# Patient Record
Sex: Female | Born: 1960 | Race: Black or African American | Hispanic: No | Marital: Single | Smoking: Current every day smoker
Health system: Southern US, Community
[De-identification: ages and names within clinical notes are randomized; demographics above are authoritative.]

## PROBLEM LIST (undated history)

## (undated) DIAGNOSIS — J45909 Unspecified asthma, uncomplicated: Secondary | ICD-10-CM

## (undated) HISTORY — PX: ABDOMINAL HYSTERECTOMY: SHX81

## (undated) HISTORY — PX: CATARACT EXTRACTION, BILATERAL: SHX1313

## (undated) HISTORY — PX: FRACTURE SURGERY: SHX138

## (undated) HISTORY — PX: HERNIA REPAIR: SHX51

## (undated) HISTORY — PX: CARPAL TUNNEL RELEASE: SHX101

---

## 2007-09-03 HISTORY — PX: BREAST BIOPSY: SHX20

## 2011-09-03 HISTORY — PX: TOTAL KNEE ARTHROPLASTY: SHX125

## 2016-02-15 LAB — HEMOGLOBIN A1C: Hemoglobin A1C: 5.9

## 2016-02-15 LAB — VITAMIN D 25 HYDROXY (VIT D DEFICIENCY, FRACTURES): Vit D, 25-Hydroxy: 20.3

## 2016-02-15 LAB — VITAMIN B12: Vitamin B-12: 517

## 2017-10-03 LAB — HM PAP SMEAR: HM Pap smear: NEGATIVE

## 2017-10-31 LAB — RESULTS CONSOLE HPV: CHL HPV: NEGATIVE

## 2018-05-22 LAB — TSH: TSH: 1.21 (ref 0.41–5.90)

## 2018-05-22 LAB — HEPATIC FUNCTION PANEL
Alkaline Phosphatase: 107 (ref 25–125)
Bilirubin, Total: 0.3

## 2018-05-22 LAB — CBC AND DIFFERENTIAL
HCT: 43 (ref 36–46)
Hemoglobin: 14.2 (ref 12.0–16.0)
Neutrophils Absolute: 3
Platelets: 8 — AB (ref 150–399)
WBC: 6.8

## 2018-05-22 LAB — BASIC METABOLIC PANEL
Creatinine: 0.6 (ref 0.5–1.1)
Glucose: 105
Potassium: 4.1 (ref 3.4–5.3)
Sodium: 140 (ref 137–147)

## 2018-05-22 LAB — CBC: RBC: 4.65 (ref 3.87–5.11)

## 2018-05-22 LAB — COMPREHENSIVE METABOLIC PANEL: GFR calc Af Amer: 89

## 2019-03-04 ENCOUNTER — Ambulatory Visit: Admission: EM | Admit: 2019-03-04 | Discharge: 2019-03-04 | Disposition: A | Discharge status: Home / Self Care

## 2019-03-04 ENCOUNTER — Encounter: Payer: Self-pay | Admitting: Emergency Medicine

## 2019-03-04 ENCOUNTER — Ambulatory Visit (INDEPENDENT_AMBULATORY_CARE_PROVIDER_SITE_OTHER)

## 2019-03-04 ENCOUNTER — Other Ambulatory Visit: Payer: Self-pay

## 2019-03-04 DIAGNOSIS — M25512 Pain in left shoulder: Secondary | ICD-10-CM

## 2019-03-04 DIAGNOSIS — X500XXA Overexertion from strenuous movement or load, initial encounter: Secondary | ICD-10-CM | POA: Diagnosis not present

## 2019-03-04 DIAGNOSIS — M7542 Impingement syndrome of left shoulder: Secondary | ICD-10-CM | POA: Diagnosis not present

## 2019-03-04 HISTORY — DX: Unspecified asthma, uncomplicated: J45.909

## 2019-03-04 MED ORDER — MELOXICAM 15 MG PO TABS: 15.0000 mg | ORAL_TABLET | Freq: Every day | ORAL | 0 refills | Status: DC

## 2019-03-04 MED ORDER — METHYLPREDNISOLONE 4 MG PO TBPK: ORAL_TABLET | ORAL | 0 refills | Status: DC

## 2019-03-04 NOTE — Discharge Instructions (Addendum)
It was very nice seeing you today in clinic. Thank you for entrusting me with your care.   Please utilize the medications that we discussed. Your prescriptions have been called in to your Pharmacy. Encouraged to apply heat three times a day for at least 10-15 minutes at a time.   Make arrangements to follow up with orthopedic doctor in 1 week for re-evaluation if not improving. I have provided you the name and office contact information for an excellent local provider. If your symptoms/condition worsens, please seek follow up care either here or in the ER. Please remember, our [place] providers are "right here with you" when you need [place].   Again, it was my pleasure to take care of you today. Thank you for choosing our clinic. I hope that you start to feel better quickly.   Honor Loh, MSN, APRN, FNP-C, CEN Advanced Practice Provider [facility]

## 2019-03-04 NOTE — ED Provider Notes (Signed)
[place], [place]   Name: Tamara ParkinsCarenda Neff DOB: 02/23/1961 MRN: [id number] CSN: [id number] PCP: Patient, No Pcp Per  Arrival date and time:  03/04/19 1005  Chief Complaint:  Shoulder Pain   NOTE: Prior to seeing the patient today, I have reviewed the triage nursing documentation and vital signs. Clinical staff has updated patient's PMH/PSHx, current medication list, and drug allergies/intolerances to ensure comprehensive history available to assist in medical decision making.   History:   HPI: Tamara George is a 58 y.o. female who presents today with complaints of progressively worsening pain in her LEFT shoulder than began approximately 1 month ago. Patient denies acute or past history of injury to her LEFT shoulder/upper extremity. She notes that she does a lot of heavy lifting and reaching at work, which is what she feels is contributing to the pain in her shoulder. AROM is painful. She states, "it feels like my shoulder is swelling inside". She has generalized weakness with some intermittent paraesthesias distally. Patient advises that pain is exacerbated by lifting and certain movements. Pain tends to be worse at night as patient is a side sleeper. She notes that her sleep quality has been adversely affected by her pain. Despite her symptoms, patient has not taken any over the counter interventions to help improve/relieve her reported symptoms.    Past Medical History:  Diagnosis Date   Asthma     Past Surgical History:  Procedure Laterality Date   FRACTURE SURGERY     HERNIA REPAIR     REPEAT CESAREAN SECTION     TOTAL KNEE ARTHROPLASTY      Family History  Problem Relation Age of Onset   Hypertension Other    Hypertension Maternal Aunt    Diabetes Maternal Uncle     Social History   Tobacco Use   Smoking status: Current Some Day Smoker   Smokeless tobacco: Never Used  Substance Use Topics   Alcohol use: Never    Frequency: Never   Drug use: Not on file     There are no active problems to display for this patient.   Home Medications:    Current Meds  Medication Sig   albuterol (ACCUNEB) 0.63 MG/3ML nebulizer solution Take 1 ampule by nebulization every 6 (six) hours as needed for wheezing.   Fluticasone-Salmeterol (ADVAIR) 100-50 MCG/DOSE AEPB Inhale 1 puff into the lungs 2 (two) times daily.    Allergies:   Aspirin and Tramadol  Review of Systems (ROS): Review of Systems  Constitutional: Negative for chills and fever.  Respiratory: Negative for cough and shortness of breath.   Cardiovascular: Negative for chest pain and palpitations.  Musculoskeletal: Positive for arthralgias (LEFT shoulder). Negative for joint swelling, myalgias, neck pain and neck stiffness.  Neurological: Positive for weakness (LUE) and numbness (LUE).     Vital Signs: Today's Vitals   03/04/19 1055 03/04/19 1102 03/04/19 1207  BP:  132/83   Pulse:  64   Resp:  18   Temp:  98.2 F (36.8 C)   TempSrc:  Oral   SpO2:  97%   Weight: 165 lb (74.8 kg)    Height: 5\' 2"  (1.575 m)    PainSc: 7   0-No pain    Physical Exam: Physical Exam  Constitutional: She is oriented to person, place, and time and well-developed, well-nourished, and in no distress.  HENT:  Head: Normocephalic and atraumatic.  Mouth/Throat: Mucous membranes are normal.  Eyes: Pupils are equal, round, and reactive to light. EOM are normal.  Neck: Normal range of motion and full passive range of motion without pain. Neck supple. No spinous process tenderness and no muscular tenderness present. No tracheal deviation present.  Cardiovascular: Normal rate, regular rhythm, normal heart sounds and intact distal pulses. Exam reveals no gallop and no friction rub.  No murmur heard. Pulmonary/Chest: Effort normal and breath sounds normal. No respiratory distress. She has no wheezes. She has no rales.  Musculoskeletal:     Left shoulder: She exhibits decreased range of motion (2/2 pain),  tenderness, pain and decreased strength (opposed muscle strength 4/5 as compared contralaterally.). She exhibits no swelling, no crepitus, no deformity, no spasm and normal pulse.     Left upper arm: She exhibits tenderness. She exhibits no swelling and no deformity.     Comments: Normal sensation, temperature, and color. Capillary refill normal.  Neurological: She is alert and oriented to person, place, and time. Gait normal. GCS score is 15.  Reflex Scores:      Tricep reflexes are 3+ on the right side and 4+ on the left side.      Bicep reflexes are 3+ on the right side and 4+ on the left side.      Brachioradialis reflexes are 4+ on the right side and 4+ on the left side. Skin: Skin is warm and dry. No rash noted. No erythema.  Psychiatric: Mood, memory, affect and judgment normal.  Nursing note and vitals reviewed.   Urgent Care Treatments / Results:   LABS: PLEASE NOTE: all labs that were ordered this encounter are listed, however only abnormal results are displayed. Labs Reviewed - No data to display  EKG: -None  RADIOLOGY: Dg Shoulder Left  Result Date: 03/04/2019 CLINICAL DATA:  One month history of left shoulder pain. EXAM: LEFT SHOULDER - 2+ VIEW COMPARISON:  None. FINDINGS: Moderate AC joint and mild glenohumeral joint degenerative changes. Os acromial noted. The visualized left ribs are intact and the visualized left lung is clear. IMPRESSION: AC joint degenerative changes and os acromial could contribute to impingement. Mild glenohumeral joint degenerative changes. No acute bony findings. Electronically Signed   By: Marijo Sanes M.D.   On: 03/04/2019 11:41    PROCEDURES: Procedures  MEDICATIONS RECEIVED THIS VISIT: Medications - No data to display  PERTINENT CLINICAL COURSE NOTES/UPDATES:   Initial Impression / Assessment and Plan / Urgent Care Course:  Pertinent labs & imaging results that were available during my care of the patient were personally reviewed by me  and considered in my medical decision making (see lab/imaging section of note for values and interpretations).  Tamara George is a 58 y.o. female who presents to [facility] today with complaints of Shoulder Pain   Patient overall well appearing and in no acute distress today in clinic. Exam reveals slight muscle weakness and decreased reflexes in patient's LEFT upper extremity. She is able to go throough normal AROM motions, however abduction causes severe pain. (+) intermittent distal paraesthesias. Symptoms have been worsening x 1 month. Diagnostic radiographs of the LEFT shoulder reveal degenerative changes of the [facility] joint that could be contributing to patient's pain. Suspect impingement syndrome of the LEFT shoulder. Given her pain and paraesthesias, will treat with antiinflammatory (meloxicam) and systemic steroid (Medrol) course. She was encouraged to apply heat/ice TID for at least 10-15 minutes at a time. She was encouraged to rest her arm/shoulder as much as possible over the next few days. Specifically, she was advised to avoid heavy lifting and reaching. Patient may benefit  from physical therapy to help with her pain, however I will defer to orthopedics for this recommendation.   Patient needs to be seen for further evaluation by orthopedics if not improving with the prescribed interventions. Name and office contact information provided on today's AVS for Dr. Juanell FairlyKevin Krasinski. Patient advised the she will need to contact the office to schedule an appointment to be seen.   Current clinical condition warrants patient being out of work in order to recover from her current injury/illness. She was provided with the appropriate documentation to provide to her place of employment that will allow for her to RTW on 03/07/2019 with no imposed restrictions.   I have reviewed the follow up and strict return precautions for any new or worsening symptoms. Patient is aware of symptoms that would be  deemed urgent/emergent, and would thus require further evaluation either here or in the emergency department. At the time of discharge, she verbalized understanding and consent with the discharge plan as it was reviewed with her. All questions were fielded by provider and/or clinic staff prior to patient discharge.    Final Clinical Impressions(s) / Urgent Care Diagnoses:   Final diagnoses:  Nontraumatic shoulder pain, left  Impingement syndrome, shoulder, left    New Prescriptions:   Meds ordered this encounter  Medications   methylPREDNISolone (MEDROL DOSEPAK) 4 MG TBPK tablet    Sig: Taper per package instructions.    Dispense:  21 tablet    Refill:  0   meloxicam (MOBIC) 15 MG tablet    Sig: Take 1 tablet (15 mg total) by mouth daily.    Dispense:  30 tablet    Refill:  0    Controlled Substance Prescriptions:  [place] Controlled Substance Registry consulted? Not Applicable  Recommended Follow up Care:  Patient encouraged to follow up with the following provider within the specified time frame, or sooner as dictated by the severity of her symptoms. If she improves, the follow up recommendations are on an as needed basis. As always, she was instructed that for any urgent/emergent care needs, she should seek care either here or in the emergency department for more immediate evaluation.  Follow-up Information    Juanell FairlyKrasinski, Kevin, MD In 1 week.   Specialty: Orthopedic Surgery Contact information: [address] [place] [address] [phone]          NOTE: This note was prepared using Dragon dictation software along with smaller phrase technology. Despite my best ability to proofread, there is the potential that transcriptional errors may still occur from this process, and are completely unintentional.     Verlee MonteGray, Cierra Rothgeb E, NP 03/05/19 2038

## 2019-03-04 NOTE — ED Triage Notes (Signed)
Patient states she has been having left shoulder pain for the past month.  Patient states the pain has gotten increasingly worse

## 2019-04-21 ENCOUNTER — Encounter: Payer: Self-pay | Admitting: Internal Medicine

## 2019-04-21 ENCOUNTER — Ambulatory Visit (INDEPENDENT_AMBULATORY_CARE_PROVIDER_SITE_OTHER): Admitting: Internal Medicine

## 2019-04-21 ENCOUNTER — Other Ambulatory Visit: Payer: Self-pay

## 2019-04-21 VITALS — BP 118/78 | HR 96 | Ht 62.0 in | Wt 144.0 lb

## 2019-04-21 DIAGNOSIS — M1812 Unilateral primary osteoarthritis of first carpometacarpal joint, left hand: Secondary | ICD-10-CM | POA: Diagnosis not present

## 2019-04-21 DIAGNOSIS — J4530 Mild persistent asthma, uncomplicated: Secondary | ICD-10-CM

## 2019-04-21 DIAGNOSIS — G8929 Other chronic pain: Secondary | ICD-10-CM | POA: Diagnosis not present

## 2019-04-21 DIAGNOSIS — M25512 Pain in left shoulder: Secondary | ICD-10-CM | POA: Diagnosis not present

## 2019-04-21 MED ORDER — METHOCARBAMOL 500 MG PO TABS: 500.0000 mg | ORAL_TABLET | Freq: Every day | ORAL | 1 refills | Status: DC

## 2019-04-21 MED ORDER — FLUTICASONE-SALMETEROL 250-50 MCG/DOSE IN AEPB: 1.0000 | INHALATION_SPRAY | Freq: Two times a day (BID) | RESPIRATORY_TRACT | 5 refills | Status: DC

## 2019-04-21 MED ORDER — ALBUTEROL SULFATE HFA 108 (90 BASE) MCG/ACT IN AERS: 2.0000 | INHALATION_SPRAY | Freq: Four times a day (QID) | RESPIRATORY_TRACT | 5 refills | Status: DC | PRN

## 2019-04-21 NOTE — Progress Notes (Signed)
Date:  04/21/2019   Name:  Tamara ParkinsCarenda George   DOB:  05/14/1961   MRN:  [id number]   Chief Complaint: Establish Care, Shoulder Pain (Started in May of this year. Seen UC here in [place] and had a XRAY showing a pinched nerve and cervical radiculopathy. Cannot take NSAIDs because they upset her stomach. ), and Asthma (Need RF on Advair and Proair inhalers.)  Shoulder Pain  The pain is present in the left shoulder and left arm. This is a chronic (She notes that she does a lot of heavy lifting and reaching at work, which is what she feels is contributing to the pain in her shoulder. AROM is painful. She states, "it feels like my shoulder is swelling inside". She has generalized weakness with some in) problem. The current episode started more than 1 month ago. The problem occurs constantly. The problem has been gradually worsening. The quality of the pain is described as aching and pounding. The pain is moderate. Associated symptoms include an inability to bear weight, joint swelling and a limited range of motion. Pertinent negatives include no fever. The symptoms are aggravated by activity and lying down. She has tried NSAIDS, cold, heat and acetaminophen (and prednisone taper) for the symptoms. The treatment provided no relief. Her past medical history is significant for osteoarthritis.  Asthma She complains of chest tightness, cough and wheezing. This is a chronic (since childhood) problem. The problem occurs intermittently. The cough is non-productive. Associated symptoms include myalgias. Pertinent negatives include no chest pain, fever or headaches. Her symptoms are alleviated by beta-agonist and steroid inhaler. She reports significant improvement on treatment. Her past medical history is significant for asthma.    Review of Systems  Constitutional: Negative for chills, fatigue and fever.  Respiratory: Positive for cough and wheezing.   Cardiovascular: Negative for chest pain, palpitations and leg  swelling.  Gastrointestinal: Negative for abdominal pain, constipation and diarrhea.  Musculoskeletal: Positive for arthralgias, joint swelling, myalgias and neck stiffness.  Allergic/Immunologic: Negative for environmental allergies.  Neurological: Negative for dizziness, light-headedness and headaches.  Psychiatric/Behavioral: Positive for sleep disturbance (due to pain).    There are no active problems to display for this patient.   Allergies  Allergen Reactions  . Aspirin Diarrhea  . Naproxen Diarrhea  . Tramadol Other (See Comments)    MIGRAINES    Past Surgical History:  Procedure Laterality Date  . ABDOMINAL HYSTERECTOMY     partial- still have cervix and need paps  . carpel tunnel repair    . CATARACT EXTRACTION, BILATERAL    . FRACTURE SURGERY    . HERNIA REPAIR    . REPEAT CESAREAN SECTION    . TOTAL KNEE ARTHROPLASTY      Social History   Tobacco Use  . Smoking status: Current Some Day Smoker    Packs/day: 0.15    Years: 10.00    Pack years: 1.50    Types: Cigarettes  . Smokeless tobacco: Never Used  . Tobacco comment: 6 ciggs daily- trying to quit - 04/21/2019  Substance Use Topics  . Alcohol use: Never    Frequency: Never  . Drug use: Never     Medication list has been reviewed and updated.  Current Meds  Medication Sig  . acetaminophen (TYLENOL) 650 MG CR tablet Take 650 mg by mouth every 8 (eight) hours as needed for pain.  Marland Kitchen. albuterol (VENTOLIN HFA) 108 (90 Base) MCG/ACT inhaler Inhale into the lungs every 6 (six) hours as needed for  wheezing or shortness of breath.  . Fluticasone-Salmeterol (ADVAIR) 250-50 MCG/DOSE AEPB Inhale 1 puff into the lungs 2 (two) times daily.    PHQ 2/9 Scores 04/21/2019  PHQ - 2 Score 0    BP Readings from Last 3 Encounters:  04/21/19 118/78  03/04/19 132/83    Physical Exam Vitals signs and nursing note reviewed.  Constitutional:      General: She is not in acute distress.    Appearance: Normal  appearance. She is well-developed.  HENT:     Head: Normocephalic and atraumatic.  Cardiovascular:     Rate and Rhythm: Normal rate and regular rhythm.     Pulses: Normal pulses.     Heart sounds: No murmur.  Pulmonary:     Effort: Pulmonary effort is normal. No respiratory distress.     Breath sounds: No wheezing or rhonchi.  Musculoskeletal:     Left shoulder: She exhibits decreased range of motion, tenderness, bony tenderness and crepitus.     Cervical back: She exhibits decreased range of motion, tenderness and spasm.       Arms:     Right lower leg: No edema.     Left lower leg: No edema.  Skin:    General: Skin is warm and dry.     Findings: No rash.  Neurological:     Mental Status: She is alert and oriented to person, place, and time.  Psychiatric:        Attention and Perception: Attention normal.        Mood and Affect: Mood normal.        Behavior: Behavior normal.        Thought Content: Thought content normal.     Wt Readings from Last 3 Encounters:  04/21/19 144 lb (65.3 kg)  03/04/19 165 lb (74.8 kg)    BP 118/78   Pulse 96   Ht 5\' 2"  (1.575 m)   Wt 144 lb (65.3 kg)   SpO2 97%   BMI 26.34 kg/m   Assessment and Plan: 1. Chronic left shoulder pain Markedly worsening pain and limitation of ROM with crepitus and associated spasm Pt has no improvement with prednisone taper She can not take nsaids or tramadol Will add muscle relaxant at bedtime and refer to ORtho - methocarbamol (ROBAXIN) 500 MG tablet; Take 1 tablet (500 mg total) by mouth at bedtime.  Dispense: 30 tablet; Refill: 1 - Ambulatory referral to Orthopedic Surgery  2. Mild persistent asthma without complication Mild chronic sx without recent exacerbation requiring ED visits or intervention Continue current therapy Recommend PPV-23 - Fluticasone-Salmeterol (ADVAIR) 250-50 MCG/DOSE AEPB; Inhale 1 puff into the lungs 2 (two) times daily.  Dispense: 60 each; Refill: 5 - albuterol (VENTOLIN  HFA) 108 (90 Base) MCG/ACT inhaler; Inhale 2 puffs into the lungs every 6 (six) hours as needed for wheezing or shortness of breath.  Dispense: 18 g; Refill: 5  3. Degenerative arthritis of thumb, left Previously evaluated for possible tendon transposition to joint surgery while in [place] tylenol for now until shoulder issue is addressed    Partially dictated using Editor, commissioning. Any errors are unintentional.  Halina Maidens, MD [facility]  04/21/2019

## 2019-05-17 DIAGNOSIS — M19019 Primary osteoarthritis, unspecified shoulder: Secondary | ICD-10-CM | POA: Insufficient documentation

## 2019-08-19 ENCOUNTER — Other Ambulatory Visit: Payer: Self-pay

## 2019-08-19 DIAGNOSIS — J4530 Mild persistent asthma, uncomplicated: Secondary | ICD-10-CM

## 2019-08-19 MED ORDER — FLUTICASONE-SALMETEROL 250-50 MCG/DOSE IN AEPB: 1.0000 | INHALATION_SPRAY | Freq: Two times a day (BID) | RESPIRATORY_TRACT | 1 refills | Status: DC

## 2019-08-23 ENCOUNTER — Encounter: Admitting: Internal Medicine

## 2019-09-07 ENCOUNTER — Encounter: Admitting: Internal Medicine

## 2019-09-09 ENCOUNTER — Other Ambulatory Visit

## 2019-09-13 ENCOUNTER — Ambulatory Visit: Admit: 2019-09-13 | Admitting: Orthopedic Surgery

## 2019-09-13 SURGERY — ARTHROSCOPY, SHOULDER, WITH ROTATOR CUFF REPAIR
Anesthesia: Choice | Laterality: Right

## 2019-10-20 ENCOUNTER — Telehealth: Payer: Self-pay | Admitting: Internal Medicine

## 2019-10-20 ENCOUNTER — Other Ambulatory Visit: Payer: Self-pay

## 2019-10-20 DIAGNOSIS — J4530 Mild persistent asthma, uncomplicated: Secondary | ICD-10-CM

## 2019-10-20 MED ORDER — FLUTICASONE-SALMETEROL 250-50 MCG/DOSE IN AEPB: 1.0000 | INHALATION_SPRAY | Freq: Two times a day (BID) | RESPIRATORY_TRACT | 0 refills | Status: DC

## 2019-10-20 MED ORDER — ALBUTEROL SULFATE HFA 108 (90 BASE) MCG/ACT IN AERS: 2.0000 | INHALATION_SPRAY | Freq: Four times a day (QID) | RESPIRATORY_TRACT | 0 refills | Status: DC | PRN

## 2019-10-20 NOTE — Telephone Encounter (Signed)
Patient is requesting med [facility]ills [facility]   albuterol (VENTOLIN HFA) 108 (90 Base) MCG/ACT inhaler [[id number]]  Fluticasone-Salmeterol (ADVAIR) 250-50 MCG/DOSE AEPB   Please send to [facility] - [place], [place] - [address]  [address], [place] [address]  Phone:  [phone] Fax:  [phone]

## 2019-11-11 ENCOUNTER — Other Ambulatory Visit: Payer: Self-pay | Admitting: Internal Medicine

## 2019-11-11 DIAGNOSIS — J4530 Mild persistent asthma, uncomplicated: Secondary | ICD-10-CM | POA: Insufficient documentation

## 2019-11-12 ENCOUNTER — Other Ambulatory Visit: Payer: Self-pay

## 2019-11-12 ENCOUNTER — Ambulatory Visit (INDEPENDENT_AMBULATORY_CARE_PROVIDER_SITE_OTHER): Admitting: Internal Medicine

## 2019-11-12 ENCOUNTER — Encounter: Payer: Self-pay | Admitting: Internal Medicine

## 2019-11-12 VITALS — BP 102/78 | HR 85 | Temp 98.5°F | Resp 20 | Ht 62.0 in | Wt 151.5 lb

## 2019-11-12 DIAGNOSIS — Z0000 Encounter for general adult medical examination without abnormal findings: Secondary | ICD-10-CM

## 2019-11-12 DIAGNOSIS — Z1231 Encounter for screening mammogram for malignant neoplasm of breast: Secondary | ICD-10-CM | POA: Diagnosis not present

## 2019-11-12 DIAGNOSIS — Z23 Encounter for immunization: Secondary | ICD-10-CM

## 2019-11-12 DIAGNOSIS — F411 Generalized anxiety disorder: Secondary | ICD-10-CM | POA: Diagnosis not present

## 2019-11-12 DIAGNOSIS — Z1159 Encounter for screening for other viral diseases: Secondary | ICD-10-CM

## 2019-11-12 DIAGNOSIS — Z1211 Encounter for screening for malignant neoplasm of colon: Secondary | ICD-10-CM

## 2019-11-12 DIAGNOSIS — J4530 Mild persistent asthma, uncomplicated: Secondary | ICD-10-CM | POA: Diagnosis not present

## 2019-11-12 DIAGNOSIS — M26653 Arthropathy of bilateral temporomandibular joint: Secondary | ICD-10-CM | POA: Insufficient documentation

## 2019-11-12 DIAGNOSIS — L84 Corns and callosities: Secondary | ICD-10-CM | POA: Insufficient documentation

## 2019-11-12 DIAGNOSIS — F17200 Nicotine dependence, unspecified, uncomplicated: Secondary | ICD-10-CM

## 2019-11-12 LAB — POCT URINALYSIS DIPSTICK
Bilirubin, UA: NEGATIVE
Glucose, UA: NEGATIVE
Ketones, UA: NEGATIVE
Leukocytes, UA: NEGATIVE
Nitrite, UA: NEGATIVE
Protein, UA: NEGATIVE
Spec Grav, UA: 1.025 (ref 1.010–1.025)
Urobilinogen, UA: 0.2 E.U./dL
pH, UA: 6 (ref 5.0–8.0)

## 2019-11-12 MED ORDER — FLUTICASONE-SALMETEROL 250-50 MCG/DOSE IN AEPB: 1.0000 | INHALATION_SPRAY | Freq: Two times a day (BID) | RESPIRATORY_TRACT | 3 refills | Status: DC

## 2019-11-12 MED ORDER — SERTRALINE HCL 50 MG PO TABS: 50.0000 mg | ORAL_TABLET | Freq: Every day | ORAL | 1 refills | Status: DC

## 2019-11-12 NOTE — Progress Notes (Signed)
Date:  11/12/2019   Name:  Tamara George   DOB:  1961/08/07   MRN:  [id number]   Chief Complaint: Annual Exam (right shoulder tear.  unable to have surgery until may.  would like her feet checked today. feels that she has some anxiety and would like to discuss this.  ) Tamara George is a 59 y.o. female who presents today for her Complete Annual Exam. She feels fairly well. She reports exercising none - continues to work with shoulder pain. She reports she is sleeping poorly.   Mammogram 2019 Pap  10/2017 Colonoscopy - none  There is no immunization history on file for this patient.  Asthma She complains of chest tightness. There is no cough, shortness of breath or wheezing. This is a recurrent problem. The problem occurs rarely. The problem has been unchanged. Associated symptoms include ear pain (and fullness). Pertinent negatives include no chest pain, fever, headaches or trouble swallowing. Her symptoms are alleviated by beta-agonist. Her past medical history is significant for asthma.  Anxiety Symptoms include nervous/anxious behavior. Patient reports no chest pain, dizziness, palpitations or shortness of breath.   Her past medical history is significant for asthma.    Lab Results  Component Value Date   CREATININE 0.6 05/22/2018   NA 140 05/22/2018   K 4.1 05/22/2018   No results found for: CHOL, HDL, LDLCALC, LDLDIRECT, TRIG, CHOLHDL Lab Results  Component Value Date   TSH 1.21 05/22/2018   Lab Results  Component Value Date   HGBA1C 5.9 02/15/2016     Review of Systems  Constitutional: Negative for chills, fatigue and fever.  HENT: Positive for ear pain (and fullness). Negative for congestion, hearing loss, tinnitus, trouble swallowing and voice change.   Eyes: Negative for visual disturbance.  Respiratory: Negative for cough, chest tightness, shortness of breath and wheezing.   Cardiovascular: Negative for chest pain, palpitations and leg swelling.    Gastrointestinal: Negative for abdominal pain, constipation, diarrhea and vomiting.  Endocrine: Negative for polydipsia and polyuria.  Genitourinary: Negative for dysuria, frequency, genital sores, vaginal bleeding and vaginal discharge.  Musculoskeletal: Positive for arthralgias (shoulder pain). Negative for gait problem and joint swelling.  Skin: Negative for color change and rash.  Neurological: Negative for dizziness, tremors, light-headedness and headaches.  Hematological: Negative for adenopathy. Does not bruise/bleed easily.  Psychiatric/Behavioral: Negative for dysphoric mood and sleep disturbance. The patient is nervous/anxious.     Patient Active Problem List   Diagnosis Date Noted  . Tobacco use disorder 11/12/2019  . Mild persistent asthma without complication 86/76/7209  . Localized, primary osteoarthritis of shoulder region 05/17/2019    Allergies  Allergen Reactions  . Tramadol Other (See Comments)    MIGRAINES  . Aspirin Diarrhea  . Naproxen Diarrhea    Past Surgical History:  Procedure Laterality Date  . ABDOMINAL HYSTERECTOMY  12/209   partial- still have cervix and need paps - endometriosis  . CARPAL TUNNEL RELEASE Right   . CATARACT EXTRACTION, BILATERAL Bilateral   . FRACTURE SURGERY Right    tib-fib  . HERNIA REPAIR    . REPEAT CESAREAN SECTION    . TOTAL KNEE ARTHROPLASTY Right 2013    Social History   Tobacco Use  . Smoking status: Current Some Day Smoker    Packs/day: 0.15    Years: 10.00    Pack years: 1.50    Types: Cigarettes  . Smokeless tobacco: Never Used  . Tobacco comment: 6 ciggs daily- trying to quit - 04/21/2019  Substance Use Topics  . Alcohol use: Never  . Drug use: Never     Medication list has been reviewed and updated.  Current Meds  Medication Sig  . acetaminophen (TYLENOL) 650 MG CR tablet Take 650 mg by mouth every 8 (eight) hours as needed for pain.  Marland Kitchen albuterol (VENTOLIN HFA) 108 (90 Base) MCG/ACT inhaler  Inhale 2 puffs into the lungs every 6 (six) hours as needed for wheezing or shortness of breath.  . Fluticasone-Salmeterol (ADVAIR) 250-50 MCG/DOSE AEPB Inhale 1 puff into the lungs 2 (two) times daily.    PHQ 2/9 Scores 11/12/2019 04/21/2019  PHQ - 2 Score 0 0  PHQ- 9 Score 3 -   GAD 7 : Generalized Anxiety Score 11/12/2019  Nervous, Anxious, on Edge 3  Control/stop worrying 0  Worry too much - different things 3  Trouble relaxing 3  Restless 3  Easily annoyed or irritable 0  Afraid - awful might happen 2  Total GAD 7 Score 14  Anxiety Difficulty Not difficult at all     BP Readings from Last 3 Encounters:  11/12/19 102/78  04/21/19 118/78  03/04/19 132/83    Physical Exam Vitals and nursing note reviewed.  Constitutional:      General: She is not in acute distress.    Appearance: Normal appearance. She is well-developed.  HENT:     Head: Normocephalic and atraumatic.     Comments: TMJ pain, click and abnormal excursion    Right Ear: Tympanic membrane and ear canal normal.     Left Ear: Tympanic membrane and ear canal normal.     Nose:     Right Sinus: No maxillary sinus tenderness.     Left Sinus: No maxillary sinus tenderness.  Eyes:     General: No scleral icterus.       Right eye: No discharge.        Left eye: No discharge.     Conjunctiva/sclera: Conjunctivae normal.  Neck:     Thyroid: No thyromegaly.     Vascular: No carotid bruit.  Cardiovascular:     Rate and Rhythm: Normal rate and regular rhythm.     Pulses:          Dorsalis pedis pulses are 2+ on the right side and 2+ on the left side.       Posterior tibial pulses are 1+ on the right side and 1+ on the left side.     Heart sounds: Normal heart sounds. No murmur.  Pulmonary:     Effort: Pulmonary effort is normal. No respiratory distress.     Breath sounds: No wheezing.  Chest:     Breasts:        Right: No mass, nipple discharge, skin change or tenderness.        Left: No mass, nipple  discharge, skin change or tenderness.  Abdominal:     General: Bowel sounds are normal.     Palpations: Abdomen is soft.     Tenderness: There is no abdominal tenderness.  Musculoskeletal:     Right shoulder: Swelling, tenderness and crepitus present. Decreased range of motion.     Cervical back: Normal range of motion. No erythema.     Right lower leg: No edema.     Left lower leg: No edema.  Feet:     Right foot:     Skin integrity: Callus present.     Toenail Condition: Right toenails are normal.     Left foot:  Skin integrity: Callus present.     Toenail Condition: Left toenails are normal.     Comments: Painful corn/callus on the ball of each foot Lymphadenopathy:     Cervical: No cervical adenopathy.  Skin:    General: Skin is warm and dry.     Capillary Refill: Capillary refill takes less than 2 seconds.     Findings: No rash.  Neurological:     General: No focal deficit present.     Mental Status: She is alert and oriented to person, place, and time.     Cranial Nerves: No cranial nerve deficit.     Sensory: No sensory deficit.     Deep Tendon Reflexes: Reflexes are normal and symmetric.  Psychiatric:        Attention and Perception: Attention normal.        Mood and Affect: Mood is anxious.        Speech: Speech normal.        Behavior: Behavior normal.        Cognition and Memory: Cognition normal.     Wt Readings from Last 3 Encounters:  11/12/19 151 lb 8 oz (68.7 kg)  04/21/19 144 lb (65.3 kg)  03/04/19 165 lb (74.8 kg)    BP 102/78   Pulse 85   Temp 98.5 F (36.9 C) (Oral)   Resp 20   Ht 5\' 2"  (1.575 m)   Wt 151 lb 8 oz (68.7 kg)   SpO2 97%   BMI 27.71 kg/m   Assessment and Plan: 1. Annual physical exam - Comprehensive metabolic panel - Lipid panel - TSH - POCT urinalysis dipstick  2. Encounter for screening mammogram for breast cancer Schedule mammogram through [facility] but will need to get previous films - MM 3D SCREEN BREAST BILATERAL;  Future  3. Need for hepatitis C screening test - Hepatitis C antibody  4. Colon cancer screening Anticipate referral after she has her shoulder surgery which should be around May.  5. Generalized anxiety disorder Has been an issue in the past - had weight gain on Lexapro Will try with sertraline Follow up in 6 weeks - sertraline (ZOLOFT) 50 MG tablet; Take 1 tablet (50 mg total) by mouth daily.  Dispense: 90 tablet; Refill: 1  6. Mild persistent asthma without complication Continue Advair bid - CBC with Differential/Platelet - Fluticasone-Salmeterol (ADVAIR) 250-50 MCG/DOSE AEPB; Inhale 1 puff into the lungs 2 (two) times daily.  Dispense: 180 each; Refill: 3  7. Tobacco use disorder She does not qualify for LDCT screening  8. Need for vaccination for pneumococcus Will discuss at follow up after covid vaccines completed  9. Corn of foot - Ambulatory referral to Podiatry  10. Arthropathy of both temporomandibular joints Likely the cause of her ear pain Recommend heat or ice Topical nsaids such as Voltaren gel   Partially dictated using June. Any errors are unintentional.  Animal nutritionist, MD [facility]  11/12/2019

## 2019-11-13 LAB — CBC WITH DIFFERENTIAL/PLATELET
Basophils Absolute: 0.1 10*3/uL (ref 0.0–0.2)
Basos: 1 %
EOS (ABSOLUTE): 0.2 10*3/uL (ref 0.0–0.4)
Eos: 4 %
Hematocrit: 40.7 % (ref 34.0–46.6)
Hemoglobin: 13.6 g/dL (ref 11.1–15.9)
Immature Grans (Abs): 0 10*3/uL (ref 0.0–0.1)
Immature Granulocytes: 0 %
Lymphocytes Absolute: 2.8 10*3/uL (ref 0.7–3.1)
Lymphs: 46 %
MCH: 30.6 pg (ref 26.6–33.0)
MCHC: 33.4 g/dL (ref 31.5–35.7)
MCV: 92 fL (ref 79–97)
Monocytes Absolute: 0.5 10*3/uL (ref 0.1–0.9)
Monocytes: 7 %
Neutrophils Absolute: 2.6 10*3/uL (ref 1.4–7.0)
Neutrophils: 42 %
Platelets: 359 10*3/uL (ref 150–450)
RBC: 4.45 x10E6/uL (ref 3.77–5.28)
RDW: 13 % (ref 11.7–15.4)
WBC: 6.2 10*3/uL (ref 3.4–10.8)

## 2019-11-13 LAB — COMPREHENSIVE METABOLIC PANEL
ALT: 14 IU/L (ref 0–32)
AST: 13 IU/L (ref 0–40)
Albumin/Globulin Ratio: 1.6 (ref 1.2–2.2)
Albumin: 4.4 g/dL (ref 3.8–4.9)
Alkaline Phosphatase: 99 IU/L (ref 39–117)
BUN/Creatinine Ratio: 17 (ref 9–23)
BUN: 12 mg/dL (ref 6–24)
Bilirubin Total: 0.3 mg/dL (ref 0.0–1.2)
CO2: 22 mmol/L (ref 20–29)
Calcium: 9.7 mg/dL (ref 8.7–10.2)
Chloride: 106 mmol/L (ref 96–106)
Creatinine, Ser: 0.72 mg/dL (ref 0.57–1.00)
GFR calc Af Amer: 107 mL/min/{1.73_m2} (ref >59)
GFR calc non Af Amer: 93 mL/min/{1.73_m2} (ref >59)
Globulin, Total: 2.7 g/dL (ref 1.5–4.5)
Glucose: 103 mg/dL — ABNORMAL HIGH (ref 65–99)
Potassium: 4.6 mmol/L (ref 3.5–5.2)
Sodium: 141 mmol/L (ref 134–144)
Total Protein: 7.1 g/dL (ref 6.0–8.5)

## 2019-11-13 LAB — TSH: TSH: 0.698 u[IU]/mL (ref 0.450–4.500)

## 2019-11-13 LAB — LIPID PANEL
Chol/HDL Ratio: 2.9 ratio (ref 0.0–4.4)
Cholesterol, Total: 216 mg/dL — ABNORMAL HIGH (ref 100–199)
HDL: 75 mg/dL (ref >39)
LDL Chol Calc (NIH): 121 mg/dL — ABNORMAL HIGH (ref 0–99)
Triglycerides: 117 mg/dL (ref 0–149)
VLDL Cholesterol Cal: 20 mg/dL (ref 5–40)

## 2019-11-13 LAB — HEPATITIS C ANTIBODY: Hep C Virus Ab: <0.1 s/co ratio (ref 0.0–0.9)

## 2019-12-27 ENCOUNTER — Ambulatory Visit: Admitting: Internal Medicine

## 2019-12-27 NOTE — Progress Notes (Deleted)
Date:  12/27/2019   Name:  Tamara George   DOB:  11-Feb-1961   MRN:  [id number]   Chief Complaint: No chief complaint on file.  Anxiety Presents for follow-up visit. Symptoms include excessive worry, nervous/anxious behavior and restlessness.   Compliance with medications is 76-100% (started sertraline 6 weeks ago).    Lab Results  Component Value Date   CREATININE 0.72 11/12/2019   BUN 12 11/12/2019   NA 141 11/12/2019   K 4.6 11/12/2019   CL 106 11/12/2019   CO2 22 11/12/2019   Lab Results  Component Value Date   CHOL 216 (H) 11/12/2019   HDL 75 11/12/2019   LDLCALC 121 (H) 11/12/2019   TRIG 117 11/12/2019   CHOLHDL 2.9 11/12/2019   Lab Results  Component Value Date   TSH 0.698 11/12/2019   Lab Results  Component Value Date   HGBA1C 5.9 02/15/2016   Lab Results  Component Value Date   WBC 6.2 11/12/2019   HGB 13.6 11/12/2019   HCT 40.7 11/12/2019   MCV 92 11/12/2019   PLT 359 11/12/2019   Lab Results  Component Value Date   ALT 14 11/12/2019   AST 13 11/12/2019   ALKPHOS 99 11/12/2019   BILITOT 0.3 11/12/2019     Review of Systems  Psychiatric/Behavioral: The patient is nervous/anxious.     Patient Active Problem List   Diagnosis Date Noted  . Tobacco use disorder 11/12/2019  . Arthropathy of both temporomandibular joints 11/12/2019  . Corn of foot 11/12/2019  . Generalized anxiety disorder 11/12/2019  . Mild persistent asthma without complication 41/32/4401  . Localized, primary osteoarthritis of shoulder region 05/17/2019    Allergies  Allergen Reactions  . Tramadol Other (See Comments)    MIGRAINES  . Aspirin Diarrhea  . Naproxen Diarrhea    Past Surgical History:  Procedure Laterality Date  . ABDOMINAL HYSTERECTOMY  12/209   partial- still have cervix and need paps - endometriosis  . CARPAL TUNNEL RELEASE Right   . CATARACT EXTRACTION, BILATERAL Bilateral   . FRACTURE SURGERY Right    tib-fib  . HERNIA REPAIR    . REPEAT  CESAREAN SECTION    . TOTAL KNEE ARTHROPLASTY Right 2013    Social History   Tobacco Use  . Smoking status: Current Some Day Smoker    Packs/day: 0.15    Years: 10.00    Pack years: 1.50    Types: Cigarettes  . Smokeless tobacco: Never Used  . Tobacco comment: 6 ciggs daily- trying to quit - 04/21/2019  Substance Use Topics  . Alcohol use: Never  . Drug use: Never     Medication list has been reviewed and updated.  No outpatient medications have been marked as taking for the 12/27/19 encounter (Appointment) with Glean Hess, MD.    [facility] 2/9 Scores 11/12/2019 04/21/2019  PHQ - 2 Score 0 0  PHQ- 9 Score 3 -   GAD 7 : Generalized Anxiety Score 11/12/2019  Nervous, Anxious, on Edge 3  Control/stop worrying 0  Worry too much - different things 3  Trouble relaxing 3  Restless 3  Easily annoyed or irritable 0  Afraid - awful might happen 2  Total GAD 7 Score 14  Anxiety Difficulty Not difficult at all      BP Readings from Last 3 Encounters:  11/12/19 102/78  04/21/19 118/78  03/04/19 132/83    Physical Exam  Wt Readings from Last 3 Encounters:  11/12/19 151 lb 8  oz (68.7 kg)  04/21/19 144 lb (65.3 kg)  03/04/19 165 lb (74.8 kg)    There were no vitals taken for this visit.  Assessment and Plan:

## 2020-03-08 ENCOUNTER — Ambulatory Visit
Admission: RE | Admit: 2020-03-08 | Discharge: 2020-03-08 | Disposition: A | Discharge status: Home / Self Care | Source: Ambulatory Visit

## 2020-03-08 ENCOUNTER — Ambulatory Visit (INDEPENDENT_AMBULATORY_CARE_PROVIDER_SITE_OTHER)

## 2020-03-08 ENCOUNTER — Other Ambulatory Visit: Payer: Self-pay

## 2020-03-08 VITALS — BP 109/79 | HR 60 | Temp 98.2°F | Resp 18

## 2020-03-08 DIAGNOSIS — M7732 Calcaneal spur, left foot: Secondary | ICD-10-CM

## 2020-03-08 MED ORDER — PREDNISONE 50 MG PO TABS: 50.0000 mg | ORAL_TABLET | Freq: Every day | ORAL | 0 refills | Status: AC

## 2020-03-08 MED ORDER — IBUPROFEN 800 MG PO TABS: 800.0000 mg | ORAL_TABLET | Freq: Three times a day (TID) | ORAL | 0 refills | Status: DC

## 2020-03-08 NOTE — ED Triage Notes (Signed)
Pt c/o left foot pain in the arch of her foot and ankle area. Started about 5 days ago. No known injury. She has decreased ROM.

## 2020-03-08 NOTE — ED Provider Notes (Signed)
[facility]    CSN: [id number] Arrival date & time: 03/08/20  0943      History   Chief Complaint Chief Complaint  Patient presents with  . Foot Pain    left    HPI Tamara George is a 59 y.o. female.   Subjective:  Tamara George is a 59 y.o. female who presents with left foot pain. Onset of the symptoms was 4 days ago. Precipitating event: none known. Current symptoms include: ability to bear weight with pain, pain with inversion of the foot, pain with eversion of the foot and worsening symptoms after a period of activity. Aggravating factors: walking. Symptoms have stabilized. Patient has had no prior foot problems. Evaluation to date: none. Treatment to date: ice and OTC analgesics which are not very effective.  The following portions of the patient's history were reviewed and updated as appropriate: allergies, current medications, past family history, past medical history, past social history, past surgical history and problem list.         Past Medical History:  Diagnosis Date  . Asthma     Patient Active Problem List   Diagnosis Date Noted  . Tobacco use disorder 11/12/2019  . Arthropathy of both temporomandibular joints 11/12/2019  . Corn of foot 11/12/2019  . Generalized anxiety disorder 11/12/2019  . Mild persistent asthma without complication 11/11/2019  . Localized, primary osteoarthritis of shoulder region 05/17/2019    Past Surgical History:  Procedure Laterality Date  . ABDOMINAL HYSTERECTOMY  12/209   partial- still have cervix and need paps - endometriosis  . CARPAL TUNNEL RELEASE Right   . CATARACT EXTRACTION, BILATERAL Bilateral   . FRACTURE SURGERY Right    tib-fib  . HERNIA REPAIR    . REPEAT CESAREAN SECTION    . TOTAL KNEE ARTHROPLASTY Right 2013    OB History   No obstetric history on file.      Home Medications    Prior to Admission medications   Medication Sig Start Date End Date Taking? Authorizing Provider    acetaminophen (TYLENOL) 650 MG CR tablet Take 650 mg by mouth every 8 (eight) hours as needed for pain.   Yes [provider]  albuterol (VENTOLIN HFA) 108 (90 Base) MCG/ACT inhaler Inhale 2 puffs into the lungs every 6 (six) hours as needed for wheezing or shortness of breath. 10/20/19  Yes Reubin MilanBerglund, Laura H, MD  Fluticasone-Salmeterol (ADVAIR) 250-50 MCG/DOSE AEPB Inhale 1 puff into the lungs 2 (two) times daily. 11/12/19  Yes Reubin MilanBerglund, Laura H, MD  ibuprofen (ADVIL) 800 MG tablet Take 1 tablet (800 mg total) by mouth 3 (three) times daily. Take 1 tablet by mouth three times a day with food for 5 days then you may take every 8 hours as needed for pain 03/08/20   Lurline IdolMurrill, Yariel Ferraris, FNP  predniSONE (DELTASONE) 50 MG tablet Take 1 tablet (50 mg total) by mouth daily for 5 days. 03/08/20 03/13/20  Lurline IdolMurrill, Mardelle Pandolfi, FNP  sertraline (ZOLOFT) 50 MG tablet Take 1 tablet (50 mg total) by mouth daily. 11/12/19   Reubin MilanBerglund, Laura H, MD    Family History Family History  Problem Relation Age of Onset  . Hypertension Other   . Hypertension Maternal Aunt   . Diabetes Maternal Aunt   . Diabetes Maternal Uncle   . Lung cancer Father   . Diabetes Maternal Grandfather     Social History Social History   Tobacco Use  . Smoking status: Current Some Day Smoker    Packs/day:  0.15    Years: 10.00    Pack years: 1.50    Types: Cigarettes  . Smokeless tobacco: Never Used  . Tobacco comment: 6 ciggs daily- trying to quit - 04/21/2019  Vaping Use  . Vaping Use: Never used  Substance Use Topics  . Alcohol use: Never  . Drug use: Never     Allergies   Tramadol, Aspirin, and Naproxen   Review of Systems Review of Systems  Musculoskeletal:       Left foot pain   Skin: Negative.   All other systems reviewed and are negative.    Physical Exam Triage Vital Signs ED Triage Vitals  Enc Vitals Group     BP 03/08/20 1008 109/79     Pulse Rate 03/08/20 1008 60     Resp 03/08/20 1008 18      Temp 03/08/20 1008 98.2 F (36.8 C)     Temp Source 03/08/20 1008 Oral     SpO2 03/08/20 1008 97 %     Weight --      Height --      Head Circumference --      Peak Flow --      Pain Score 03/08/20 1005 7     Pain Loc --      Pain Edu? --      Excl. in GC? --    No data found.  Updated Vital Signs BP 109/79 (BP Location: Right Arm)   Pulse 60   Temp 98.2 F (36.8 C) (Oral)   Resp 18   SpO2 97%   Visual Acuity Right Eye Distance:   Left Eye Distance:   Bilateral Distance:    Right Eye Near:   Left Eye Near:    Bilateral Near:     Physical Exam Vitals reviewed.  Constitutional:      Appearance: Normal appearance.  HENT:     Head: Normocephalic.  Cardiovascular:     Rate and Rhythm: Normal rate and regular rhythm.     Pulses:          Dorsalis pedis pulses are 2+ on the left side.  Pulmonary:     Effort: Pulmonary effort is normal.     Breath sounds: Normal breath sounds.  Musculoskeletal:        General: Normal range of motion.     Cervical back: Normal range of motion and neck supple.     Left foot: Normal range of motion. No deformity.       Feet:  Feet:     Left foot:     Skin integrity: Skin integrity normal.     Toenail Condition: Left toenails are normal.  Skin:    General: Skin is warm and dry.  Neurological:     General: No focal deficit present.     Mental Status: She is alert and oriented to person, place, and time.  Psychiatric:        Mood and Affect: Mood normal.      UC Treatments / Results  Labs (all labs ordered are listed, but only abnormal results are displayed) Labs Reviewed - No data to display  EKG   Radiology DG Foot Complete Left  Result Date: 03/08/2020 CLINICAL DATA:  LEFT foot pain, awoke with pain 03/04/2020, medial pain traveling to ankle, cannot rotate or bear weight EXAM: LEFT FOOT - COMPLETE 3 VIEW COMPARISON:  None FINDINGS: Osseous mineralization low normal. Joint spaces preserved. Plantar and Achilles  insertion calcaneal spurs. No acute fracture,  dislocation, or bone destruction. IMPRESSION: Calcaneal spurring. No acute abnormalities. Electronically Signed   By: Ulyses Southward M.D.   On: 03/08/2020 10:52    Procedures Procedures (including critical care time)  Medications Ordered in UC Medications - No data to display  Initial Impression / Assessment and Plan / UC Course  I have reviewed the triage vital signs and the nursing notes.  Pertinent labs & imaging results that were available during my care of the patient were reviewed by me and considered in my medical decision making (see chart for details).    59 yo female presenting with left foot pain x 4 days. No injury. Pain worse with movement and ambulation. No relief with symptomatic measures. X-ray of the left foot shows plantar and achilles calcaneal spurs. No fracture, dislocation, swelling or degenerative changes noted.   Plan:  Natural history and expected course discussed.  Rest, ice, compression, and elevation (RICE) therapy. Orthopedics referral.  Today's evaluation has revealed no signs of a dangerous process. Discussed diagnosis with patient and/or guardian. Patient and/or guardian aware of their diagnosis, possible red flag symptoms to watch out for and need for close follow up. Patient and/or guardian understands verbal and written discharge instructions. Patient and/or guardian comfortable with plan and disposition.  Patient and/or guardian has a clear mental status at this time, good insight into illness (after discussion and teaching) and has clear judgment to make decisions regarding their care  This care was provided during an unprecedented National Emergency due to the Novel Coronavirus (COVID-19) pandemic. COVID-19 infections and transmission risks place heavy strains on healthcare resources.  As this pandemic evolves, our facility, providers, and staff strive to respond fluidly, to remain operational, and to provide  care relative to available resources and information. Outcomes are unpredictable and treatments are without well-defined guidelines. Further, the impact of COVID-19 on all aspects of urgent care, including the impact to patients seeking care for reasons other than COVID-19, is unavoidable during this national emergency. At this time of the global pandemic, management of patients has significantly changed, even for non-COVID positive patients given high local and regional COVID volumes at this time requiring high healthcare system and resource utilization. The standard of care for management of both COVID suspected and non-COVID suspected patients continues to change rapidly at the local, regional, national, and global levels. This patient was worked up and treated to the best available but ever changing evidence and resources available at this current time.   Documentation was completed with the aid of voice recognition software. Transcription may contain typographical errors.  Final Clinical Impressions(s) / UC Diagnoses   Final diagnoses:  Calcaneal spur of left foot     Discharge Instructions     Here are a couple of things you can do for your foot pain:   ICE (put ice on your foot, leave the ice on for 20 minutes, 2-3 times a day).  Move your toes often to avoid stiffness   When possible, raise (elevate) your foot above the level of your heart while you are sitting or lying down.  Wearing specific shoes or inserts inside of shoes (orthotics) for comfort and support (try the [employer] [address] [place], [place], [place] [address])  Wearing splints on your feet while you sleep. Splints keep your feet in a position (usually 90 degrees) that should prevent and relieve the pain you feel when you first get out of bed. They also make stretching easier in the morning.  Follow-up with orthopedics if your symptoms fails to improve or reoccur     ED Prescriptions    Medication Sig  Dispense Auth. Provider   predniSONE (DELTASONE) 50 MG tablet Take 1 tablet (50 mg total) by mouth daily for 5 days. 5 tablet Lurline Idol, FNP   ibuprofen (ADVIL) 800 MG tablet Take 1 tablet (800 mg total) by mouth 3 (three) times daily. Take 1 tablet by mouth three times a day with food for 5 days then you may take every 8 hours as needed for pain 30 tablet Lurline Idol, FNP     PDMP not reviewed this encounter.   Lurline Idol, [place] 03/08/20 1118

## 2020-03-08 NOTE — Discharge Instructions (Addendum)
Here are a couple of things you can do for your foot pain:  ICE (put ice on your foot, leave the ice on for 20 minutes, 2-3 times a day). Move your toes often to avoid stiffness  When possible, raise (elevate) your foot above the level of your heart while you are sitting or lying down. Wearing specific shoes or inserts inside of shoes (orthotics) for comfort and support (try the [employer] [address] [place], [place], [place] [address]) Wearing splints on your feet while you sleep. Splints keep your feet in a position (usually 90 degrees) that should prevent and relieve the pain you feel when you first get out of bed. They also make stretching easier in the morning.  Follow-up with orthopedics if your symptoms fails to improve or reoccur

## 2020-05-09 ENCOUNTER — Other Ambulatory Visit: Payer: Self-pay

## 2020-05-09 ENCOUNTER — Encounter: Payer: Self-pay | Admitting: Internal Medicine

## 2020-05-09 ENCOUNTER — Ambulatory Visit (INDEPENDENT_AMBULATORY_CARE_PROVIDER_SITE_OTHER): Admitting: Internal Medicine

## 2020-05-09 VITALS — BP 120/70 | HR 88 | Ht 62.0 in | Wt 154.0 lb

## 2020-05-09 DIAGNOSIS — Z23 Encounter for immunization: Secondary | ICD-10-CM

## 2020-05-09 DIAGNOSIS — M1612 Unilateral primary osteoarthritis, left hip: Secondary | ICD-10-CM

## 2020-05-09 NOTE — Progress Notes (Signed)
Date:  05/09/2020   Name:  Tamara George   DOB:  August 17, 1961   MRN:  [id number]   Chief Complaint: Hip Pain (mailny L) hip but starting to effect the R) hip)  Hip Pain  There was no injury mechanism. The pain is present in the left leg. The pain is at a severity of 6/10. The pain is moderate. The pain has been worsening since onset. Associated symptoms include an inability to bear weight and a loss of motion. She reports no foreign bodies present. The symptoms are aggravated by weight bearing and movement. She has tried NSAIDs for the symptoms. The treatment provided mild relief.  She was told about 2 years go that she was heading for a hip replacement. She sees Dr. Odis Luster for her shoulder and would like to see him about her hip.  Lab Results  Component Value Date   CREATININE 0.72 11/12/2019   BUN 12 11/12/2019   NA 141 11/12/2019   K 4.6 11/12/2019   CL 106 11/12/2019   CO2 22 11/12/2019   Lab Results  Component Value Date   CHOL 216 (H) 11/12/2019   HDL 75 11/12/2019   LDLCALC 121 (H) 11/12/2019   TRIG 117 11/12/2019   CHOLHDL 2.9 11/12/2019   Lab Results  Component Value Date   TSH 0.698 11/12/2019   Lab Results  Component Value Date   HGBA1C 5.9 02/15/2016   Lab Results  Component Value Date   WBC 6.2 11/12/2019   HGB 13.6 11/12/2019   HCT 40.7 11/12/2019   MCV 92 11/12/2019   PLT 359 11/12/2019   Lab Results  Component Value Date   ALT 14 11/12/2019   AST 13 11/12/2019   ALKPHOS 99 11/12/2019   BILITOT 0.3 11/12/2019     Review of Systems  Constitutional: Negative for chills, fatigue and fever.  Respiratory: Negative for cough, chest tightness, shortness of breath and wheezing.   Cardiovascular: Negative for chest pain, palpitations and leg swelling.  Musculoskeletal: Positive for arthralgias and gait problem.  Neurological: Negative for dizziness and headaches.    Patient Active Problem List   Diagnosis Date Noted  . Tobacco use disorder  11/12/2019  . Arthropathy of both temporomandibular joints 11/12/2019  . Corn of foot 11/12/2019  . Generalized anxiety disorder 11/12/2019  . Mild persistent asthma without complication 11/11/2019  . Localized, primary osteoarthritis of shoulder region 05/17/2019    Allergies  Allergen Reactions  . Tramadol Other (See Comments)    MIGRAINES  . Aspirin Diarrhea  . Naproxen Diarrhea    Past Surgical History:  Procedure Laterality Date  . ABDOMINAL HYSTERECTOMY  12/209   partial- still have cervix and need paps - endometriosis  . CARPAL TUNNEL RELEASE Right   . CATARACT EXTRACTION, BILATERAL Bilateral   . FRACTURE SURGERY Right    tib-fib  . HERNIA REPAIR    . REPEAT CESAREAN SECTION    . TOTAL KNEE ARTHROPLASTY Right 2013    Social History   Tobacco Use  . Smoking status: Current Some Day Smoker    Packs/day: 0.15    Years: 10.00    Pack years: 1.50    Types: Cigarettes  . Smokeless tobacco: Never Used  . Tobacco comment: 6 ciggs daily- trying to quit - 04/21/2019  Vaping Use  . Vaping Use: Never used  Substance Use Topics  . Alcohol use: Never  . Drug use: Never     Medication list has been reviewed and updated.  Current  Meds  Medication Sig  . acetaminophen (TYLENOL) 650 MG CR tablet Take 650 mg by mouth every 8 (eight) hours as needed for pain.  Marland Kitchen albuterol (VENTOLIN HFA) 108 (90 Base) MCG/ACT inhaler Inhale 2 puffs into the lungs every 6 (six) hours as needed for wheezing or shortness of breath.  . Fluticasone-Salmeterol (ADVAIR) 250-50 MCG/DOSE AEPB Inhale 1 puff into the lungs 2 (two) times daily.  Marland Kitchen ibuprofen (ADVIL) 800 MG tablet Take 1 tablet (800 mg total) by mouth 3 (three) times daily. Take 1 tablet by mouth three times a day with food for 5 days then you may take every 8 hours as needed for pain    PHQ 2/9 Scores 05/09/2020 11/12/2019 04/21/2019  PHQ - 2 Score 0 0 0  PHQ- 9 Score 5 3 -    GAD 7 : Generalized Anxiety Score 05/09/2020 11/12/2019    Nervous, Anxious, on Edge 0 3  Control/stop worrying 0 0  Worry too much - different things 0 3  Trouble relaxing 0 3  Restless 0 3  Easily annoyed or irritable 0 0  Afraid - awful might happen 0 2  Total GAD 7 Score 0 14  Anxiety Difficulty - Not difficult at all    BP Readings from Last 3 Encounters:  05/09/20 120/70  03/08/20 109/79  11/12/19 102/78    Physical Exam Vitals and nursing note reviewed.  Constitutional:      General: She is not in acute distress.    Appearance: She is well-developed.  HENT:     Head: Normocephalic and atraumatic.  Pulmonary:     Effort: Pulmonary effort is normal. No respiratory distress.  Musculoskeletal:     Right hip: No tenderness or bony tenderness. Decreased range of motion.     Left hip: Tenderness and bony tenderness present. Decreased range of motion.     Comments: Significant decrease in ROM on left with pain  Skin:    General: Skin is warm and dry.     Findings: No rash.  Neurological:     Mental Status: She is alert and oriented to person, place, and time.     Gait: Gait abnormal (due to left hip pain).  Psychiatric:        Behavior: Behavior normal.        Thought Content: Thought content normal.     Wt Readings from Last 3 Encounters:  05/09/20 154 lb (69.9 kg)  11/12/19 151 lb 8 oz (68.7 kg)  04/21/19 144 lb (65.3 kg)    BP 120/70   Pulse 88   Ht 5\' 2"  (1.575 m)   Wt 154 lb (69.9 kg)   SpO2 96%   BMI 28.17 kg/m   Assessment and Plan: 1. Primary osteoarthritis of left hip Continue Aleve or Advil or tylenol as needed - Ambulatory referral to Orthopedic Surgery   Partially dictated using Dragon software. Any errors are unintentional.  , MD [facility]  05/09/2020

## 2020-05-29 ENCOUNTER — Telehealth: Payer: Self-pay | Admitting: Internal Medicine

## 2020-05-29 NOTE — Telephone Encounter (Signed)
Pt is having a tooth removal on Wednesday and her dentist would like her to have abx due to total knee replacement in 2014. [facility] in [place] on [address]

## 2020-05-29 NOTE — Telephone Encounter (Signed)
Spoke to pt, will call dentist.

## 2020-05-29 NOTE — Telephone Encounter (Signed)
Patient stated that she is a new patient at the dentist office she needs to take an antibiotic before being treated.  Patient stated that the dentist does not know what she is allergic and needs an update for the patient.  Please contact the dentist office, Dr. Adriana Simas at [facility] in [place] or let the patient know how she can get this info. The the dental office.  CB# [phone]

## 2020-05-29 NOTE — Telephone Encounter (Signed)
Called and left patient a VM letting her know her allergies listed. Told her that our research - abx are no longer required for dental procedures after having joint replacement surgeries. Told her on the VM if she wants abx they will need to send them in for her before the procedure.   CM

## 2020-08-18 ENCOUNTER — Telehealth: Payer: Self-pay | Admitting: Internal Medicine

## 2020-08-18 NOTE — Telephone Encounter (Signed)
Spoke to pt- she will call ortho for the referral

## 2020-08-18 NOTE — Telephone Encounter (Signed)
Pt had a total hip replacement surgery on December 9th through [facility] and needs authorization for PT with [facility] / Please advise asap   Pt wants to PT yesterday but is waiting for Authorization from Dr. Judithann Graves   Fax# [phone] [facility] / Attention: Physical therapy

## 2020-08-22 DIAGNOSIS — Z96649 Presence of unspecified artificial hip joint: Secondary | ICD-10-CM | POA: Insufficient documentation

## 2020-11-13 ENCOUNTER — Encounter: Admitting: Internal Medicine

## 2020-12-12 ENCOUNTER — Other Ambulatory Visit: Payer: Self-pay | Admitting: Internal Medicine

## 2020-12-12 DIAGNOSIS — J4530 Mild persistent asthma, uncomplicated: Secondary | ICD-10-CM

## 2021-01-17 IMAGING — CR DG FOOT COMPLETE 3+V*L*
3 series · 3 of 3 positions shown · non-contrast
Comparison: None

CLINICAL DATA: LEFT foot pain, awoke with pain 03/04/2020, medial
pain traveling to ankle, cannot rotate or bear weight

EXAM:
LEFT FOOT - COMPLETE 3 VIEW

[foot ap]
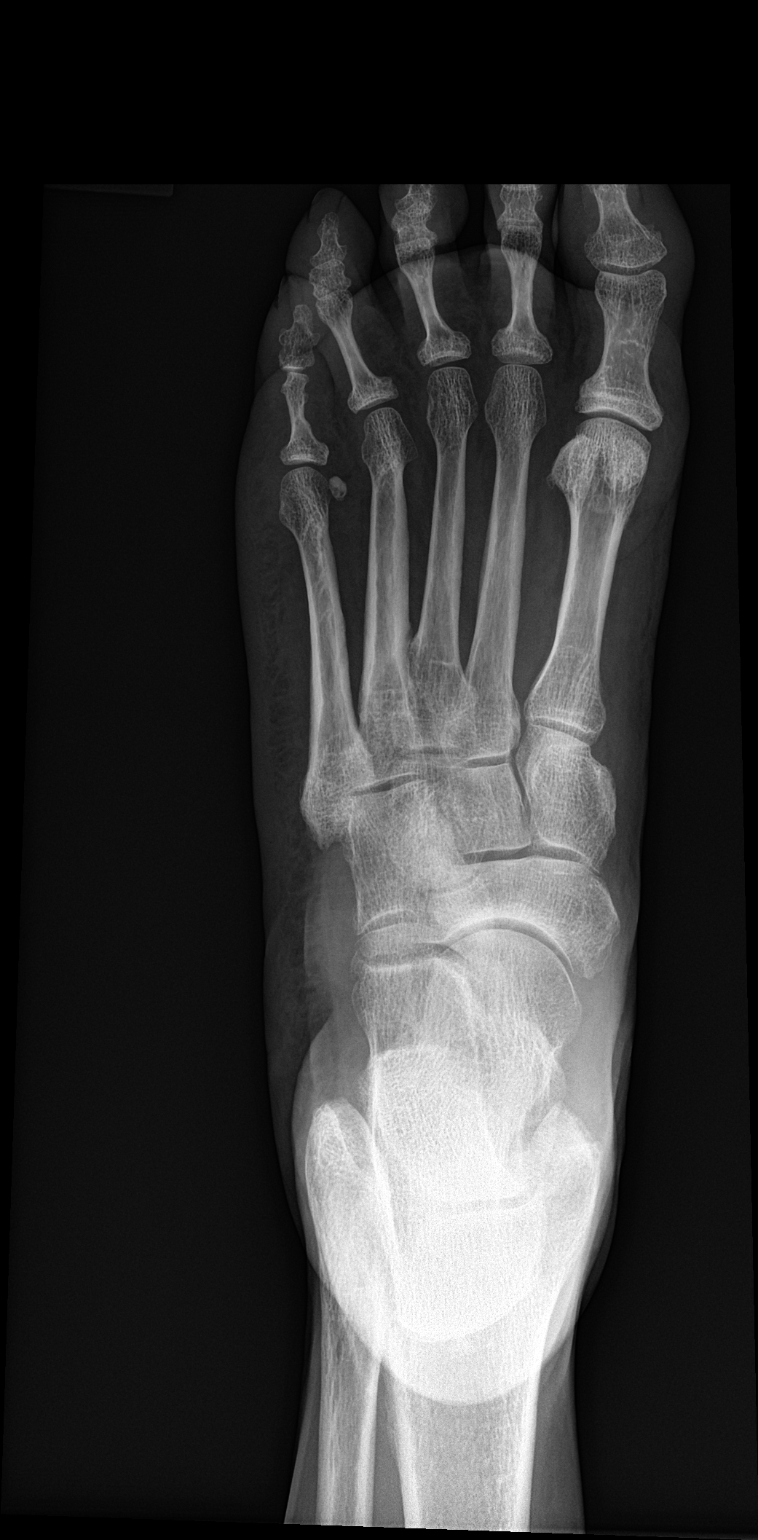

[foot obl]
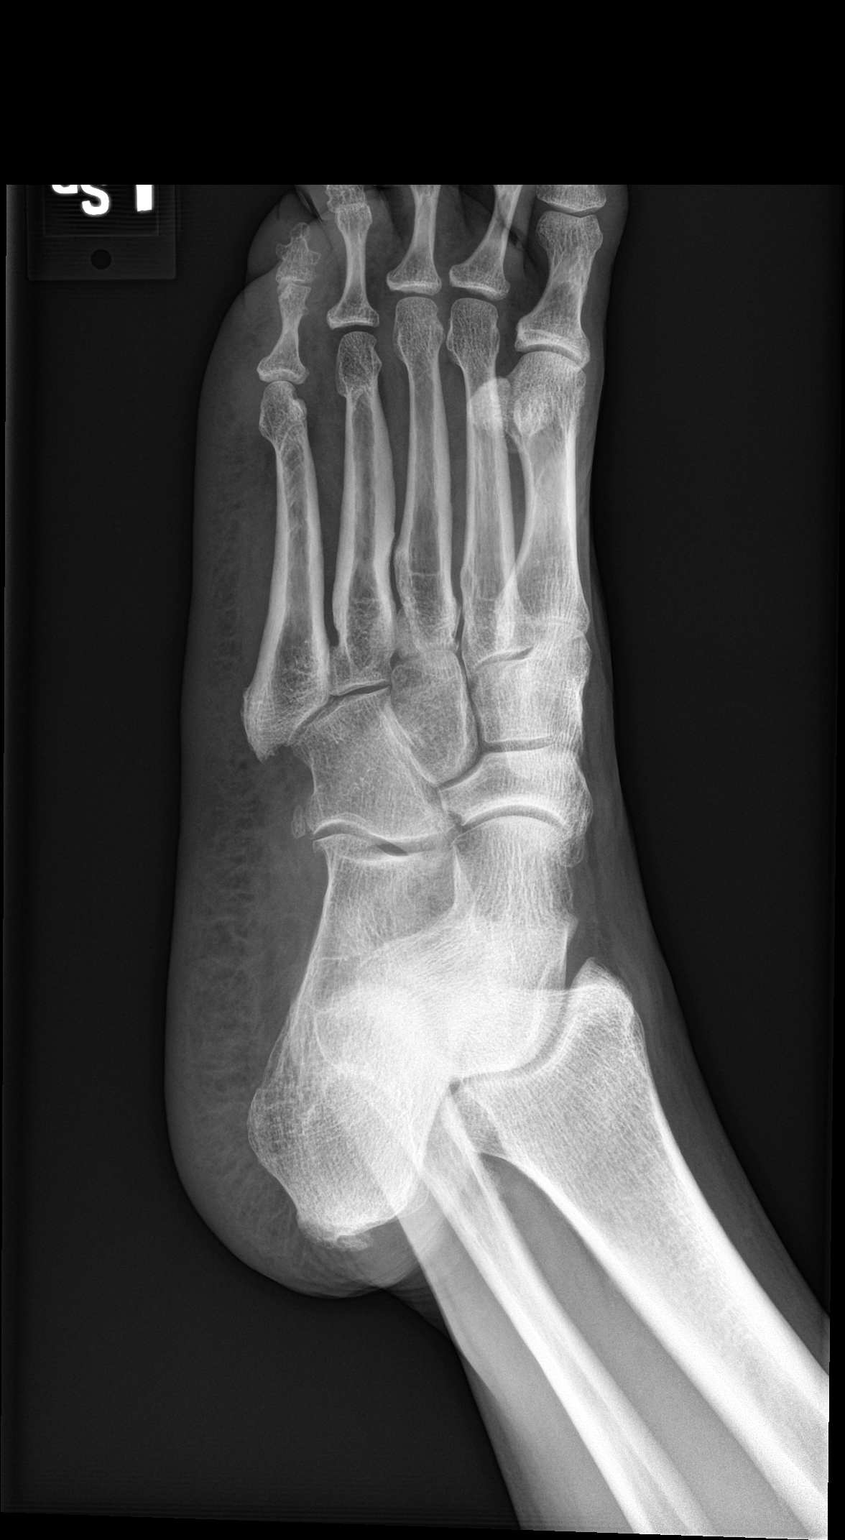

[foot lat]
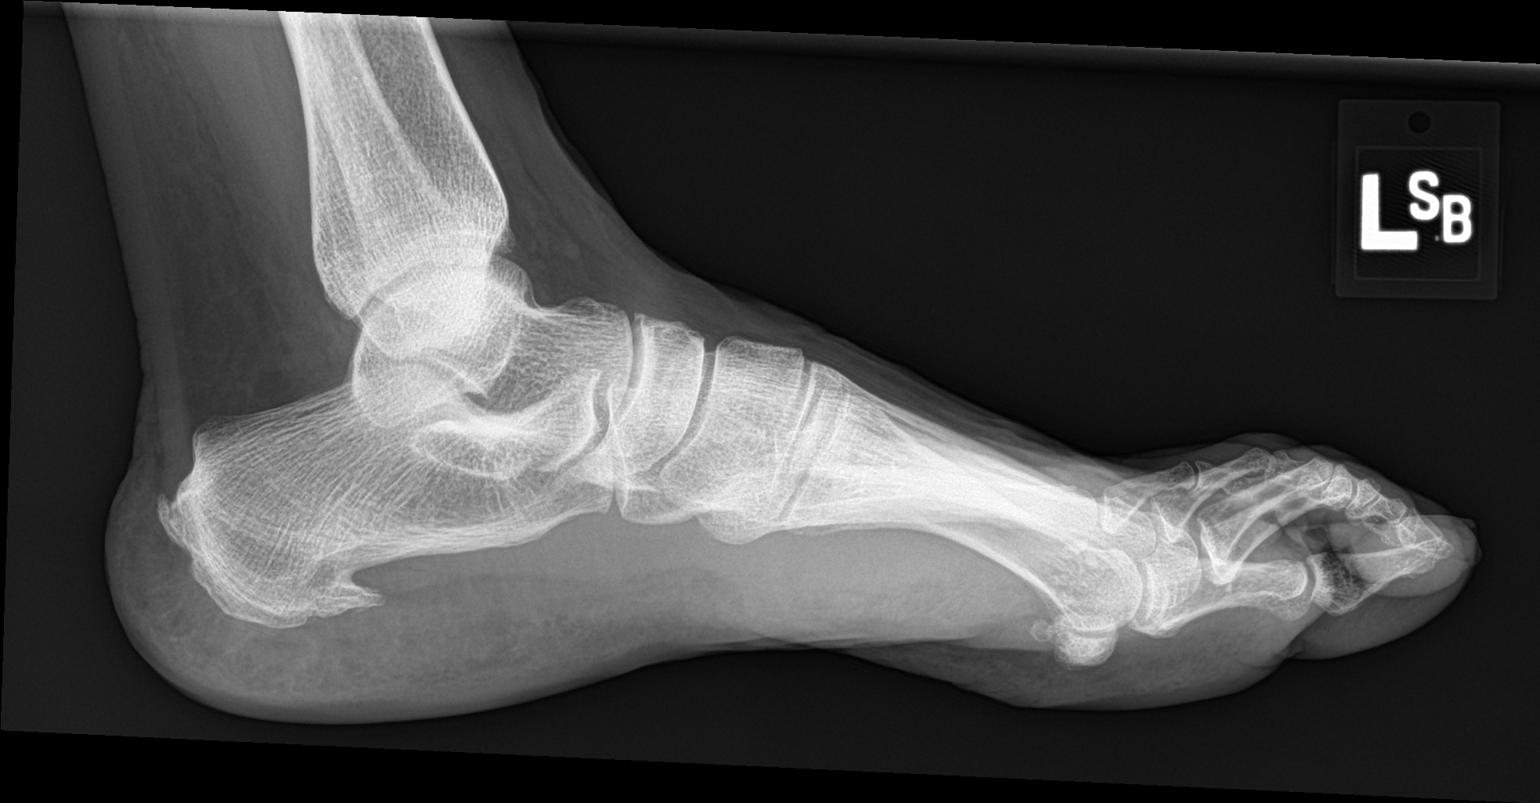

[3 of 3 positions shown; findings below may reference images not displayed]

FINDINGS: Osseous mineralization low normal.

Joint spaces preserved.

Plantar and Achilles insertion calcaneal spurs.

No acute fracture, dislocation, or bone destruction.
IMPRESSION: Calcaneal spurring.

No acute abnormalities.

## 2021-03-12 ENCOUNTER — Other Ambulatory Visit: Payer: Self-pay | Admitting: Internal Medicine

## 2021-03-12 DIAGNOSIS — J4530 Mild persistent asthma, uncomplicated: Secondary | ICD-10-CM

## 2021-03-12 NOTE — Telephone Encounter (Signed)
Pt. Has an appointment scheduled. 

## 2021-05-15 ENCOUNTER — Other Ambulatory Visit: Payer: Self-pay | Admitting: Internal Medicine

## 2021-05-16 ENCOUNTER — Ambulatory Visit (INDEPENDENT_AMBULATORY_CARE_PROVIDER_SITE_OTHER): Admitting: Internal Medicine

## 2021-05-16 ENCOUNTER — Other Ambulatory Visit: Payer: Self-pay

## 2021-05-16 ENCOUNTER — Encounter: Payer: Self-pay | Admitting: Internal Medicine

## 2021-05-16 VITALS — BP 110/68 | HR 65 | Temp 98.5°F | Ht 62.0 in | Wt 155.0 lb

## 2021-05-16 DIAGNOSIS — J4530 Mild persistent asthma, uncomplicated: Secondary | ICD-10-CM | POA: Diagnosis not present

## 2021-05-16 DIAGNOSIS — Z1231 Encounter for screening mammogram for malignant neoplasm of breast: Secondary | ICD-10-CM | POA: Diagnosis not present

## 2021-05-16 DIAGNOSIS — Z1211 Encounter for screening for malignant neoplasm of colon: Secondary | ICD-10-CM | POA: Diagnosis not present

## 2021-05-16 DIAGNOSIS — Z23 Encounter for immunization: Secondary | ICD-10-CM | POA: Diagnosis not present

## 2021-05-16 DIAGNOSIS — Z0000 Encounter for general adult medical examination without abnormal findings: Secondary | ICD-10-CM

## 2021-05-16 DIAGNOSIS — M1611 Unilateral primary osteoarthritis, right hip: Secondary | ICD-10-CM

## 2021-05-16 MED ORDER — ALBUTEROL SULFATE HFA 108 (90 BASE) MCG/ACT IN AERS: 2.0000 | INHALATION_SPRAY | Freq: Four times a day (QID) | RESPIRATORY_TRACT | 1 refills | Status: DC | PRN

## 2021-05-16 NOTE — Patient Instructions (Addendum)
Caltrate daily - calcium and vitamin D.  Continue Aleve twice a day as needed for joint pain.  Go to imaging in [place] and sign a release for your previous mammogram.  Drop off a copy of your last colonoscopy report if available.  Call for a Nurse visit to get the pneumonia vaccine (Prevnar-20)

## 2021-05-16 NOTE — Progress Notes (Signed)
Date:  05/16/2021   Name:  Tamara George   DOB:  March 03, 1961   MRN:  [id number]   Chief Complaint: Annual Exam (Breast exam no pap) and Flu Vaccine Majel Giel is a 60 y.o. female who presents today for her Complete Annual Exam. She feels well. She reports exercising walking a lot at work. She reports she is sleeping well. Breast complaints none.  Mammogram: 10/2017  DoD DEXA: none Pap smear: 10/2017 Colonoscopy: around 2010  Immunization History  Administered Date(s) Administered   Influenza,inj,Quad PF,6+ Mos 05/09/2020   PFIZER Comirnaty(Gray Top)Covid-19 Tri-Sucrose Vaccine 06/21/2020, 07/12/2020   Tdap 05/09/2020    Asthma There is no cough, shortness of breath or wheezing. This is a recurrent problem. The problem occurs rarely. Pertinent negatives include no chest pain, fever, headaches or trouble swallowing. Her symptoms are alleviated by beta-agonist and steroid inhaler. She reports significant improvement on treatment. Her past medical history is significant for asthma.   Lab Results  Component Value Date   CREATININE 0.72 11/12/2019   BUN 12 11/12/2019   NA 141 11/12/2019   K 4.6 11/12/2019   CL 106 11/12/2019   CO2 22 11/12/2019   Lab Results  Component Value Date   CHOL 216 (H) 11/12/2019   HDL 75 11/12/2019   LDLCALC 121 (H) 11/12/2019   TRIG 117 11/12/2019   CHOLHDL 2.9 11/12/2019   Lab Results  Component Value Date   TSH 0.698 11/12/2019   Lab Results  Component Value Date   HGBA1C 5.9 02/15/2016   Lab Results  Component Value Date   WBC 6.2 11/12/2019   HGB 13.6 11/12/2019   HCT 40.7 11/12/2019   MCV 92 11/12/2019   PLT 359 11/12/2019   Lab Results  Component Value Date   ALT 14 11/12/2019   AST 13 11/12/2019   ALKPHOS 99 11/12/2019   BILITOT 0.3 11/12/2019     Review of Systems  Constitutional:  Negative for chills, fatigue and fever.  HENT:  Negative for congestion, hearing loss, tinnitus, trouble swallowing and voice change.    Eyes:  Negative for visual disturbance.  Respiratory:  Negative for cough, chest tightness, shortness of breath and wheezing.   Cardiovascular:  Negative for chest pain, palpitations and leg swelling.  Gastrointestinal:  Negative for abdominal pain, constipation, diarrhea and vomiting.  Endocrine: Negative for polydipsia and polyuria.  Genitourinary:  Negative for dysuria, frequency, genital sores, vaginal bleeding and vaginal discharge.  Musculoskeletal:  Positive for arthralgias (right hip, left thumb, knees). Negative for gait problem and joint swelling.  Skin:  Negative for color change and rash.  Neurological:  Negative for dizziness, tremors, light-headedness and headaches.  Hematological:  Negative for adenopathy. Does not bruise/bleed easily.  Psychiatric/Behavioral:  Negative for dysphoric mood and sleep disturbance. The patient is not nervous/anxious.    Patient Active Problem List   Diagnosis Date Noted   History of total hip arthroplasty 08/22/2020   Tobacco use disorder 11/12/2019   Arthropathy of both temporomandibular joints 11/12/2019   Corn of foot 11/12/2019   Generalized anxiety disorder 11/12/2019   Mild persistent asthma without complication 11/11/2019   Localized, primary osteoarthritis of shoulder region 05/17/2019    Allergies  Allergen Reactions   Tramadol Other (See Comments)    MIGRAINES   Aspirin Diarrhea   Naproxen Diarrhea    Past Surgical History:  Procedure Laterality Date   ABDOMINAL HYSTERECTOMY  12/209   partial- still have cervix and need paps - endometriosis   CARPAL  TUNNEL RELEASE Right    CATARACT EXTRACTION, BILATERAL Bilateral    FRACTURE SURGERY Right    tib-fib   HERNIA REPAIR     REPEAT CESAREAN SECTION     TOTAL KNEE ARTHROPLASTY Right 2013    Social History   Tobacco Use   Smoking status: Some Days    Packs/day: 0.15    Years: 10.00    Pack years: 1.50    Types: Cigarettes   Smokeless tobacco: Never   Tobacco  comments:    4 cigs daily- trying to quit - 2022  Vaping Use   Vaping Use: Never used  Substance Use Topics   Alcohol use: Never   Drug use: Never     Medication list has been reviewed and updated.  Current Meds  Medication Sig   acetaminophen (TYLENOL) 650 MG CR tablet Take 650 mg by mouth every 8 (eight) hours as needed for pain.   albuterol (VENTOLIN HFA) 108 (90 Base) MCG/ACT inhaler Inhale 2 puffs into the lungs every 6 (six) hours as needed for wheezing or shortness of breath.   WIXELA INHUB 250-50 MCG/ACT AEPB USE 1 INHALATION TWICE A DAY (MUST SCHEDULE OFFICE VISIT PRIOR TO FURTHER REFILLS)   [DISCONTINUED] fluticasone-salmeterol (ADVAIR DISKUS) 250-50 MCG/ACT AEPB    [DISCONTINUED] meloxicam (MOBIC) 15 MG tablet Take 15 mg by mouth daily.   [DISCONTINUED] rivaroxaban (XARELTO) 10 MG TABS tablet     PHQ 2/9 Scores 05/16/2021 05/09/2020 11/12/2019 04/21/2019  PHQ - 2 Score 0 0 0 0  PHQ- 9 Score 1 5 3  -    GAD 7 : Generalized Anxiety Score 05/16/2021 05/09/2020 11/12/2019  Nervous, Anxious, on Edge 0 0 3  Control/stop worrying 1 0 0  Worry too much - different things 1 0 3  Trouble relaxing 1 0 3  Restless 0 0 3  Easily annoyed or irritable 0 0 0  Afraid - awful might happen 0 0 2  Total GAD 7 Score 3 0 14  Anxiety Difficulty - - Not difficult at all    BP Readings from Last 3 Encounters:  05/16/21 110/68  05/09/20 120/70  03/08/20 109/79    Physical Exam Vitals and nursing note reviewed.  Constitutional:      General: She is not in acute distress.    Appearance: She is well-developed.  HENT:     Head: Normocephalic and atraumatic.     Right Ear: Tympanic membrane and ear canal normal.     Left Ear: Tympanic membrane and ear canal normal.     Nose:     Right Sinus: No maxillary sinus tenderness.     Left Sinus: No maxillary sinus tenderness.  Eyes:     General: No scleral icterus.       Right eye: No discharge.        Left eye: No discharge.      Conjunctiva/sclera: Conjunctivae normal.  Neck:     Thyroid: No thyromegaly.     Vascular: No carotid bruit.  Cardiovascular:     Rate and Rhythm: Normal rate and regular rhythm.     Pulses: Normal pulses.     Heart sounds: Normal heart sounds.  Pulmonary:     Effort: Pulmonary effort is normal. No respiratory distress.     Breath sounds: No wheezing.  Chest:  Breasts:    Right: No mass, nipple discharge, skin change or tenderness.     Left: Tenderness (mild tenderness laterally at site of previous bx) present. No mass, nipple discharge  or skin change.  Abdominal:     General: Bowel sounds are normal.     Palpations: Abdomen is soft.     Tenderness: There is no abdominal tenderness.  Musculoskeletal:     Cervical back: Normal range of motion. No erythema.     Right lower leg: No edema.     Left lower leg: No edema.  Lymphadenopathy:     Cervical: No cervical adenopathy.  Skin:    General: Skin is warm and dry.     Findings: No rash.  Neurological:     Mental Status: She is alert and oriented to person, place, and time.     Cranial Nerves: No cranial nerve deficit.     Sensory: No sensory deficit.     Deep Tendon Reflexes: Reflexes are normal and symmetric.  Psychiatric:        Attention and Perception: Attention normal.        Mood and Affect: Mood normal.    Wt Readings from Last 3 Encounters:  05/16/21 155 lb (70.3 kg)  05/09/20 154 lb (69.9 kg)  11/12/19 151 lb 8 oz (68.7 kg)    BP 110/68   Pulse 65   Temp 98.5 F (36.9 C) (Oral)   Ht 5\' 2"  (1.575 m)   Wt 155 lb (70.3 kg)   SpO2 96%   BMI 28.35 kg/m   Assessment and Plan: 1. Annual physical exam Normal exam. Continue healthy diet and exercise. Recommend calcium and vitamin D daily. - CBC with Differential/Platelet - Comprehensive metabolic panel - Hemoglobin A1c - Lipid panel - TSH  2. Encounter for screening mammogram for breast cancer Schedule at [facility]. Will need to sign a release for prior  films. - MM 3D SCREEN BREAST BILATERAL  3. Colon cancer screening Over-due for screening. Request a copy of her last colonoscopy which pt has at home. - Ambulatory referral to Gastroenterology  4. Mild persistent asthma without complication Asthma is very well controlled. No recent ED visits or URIs. Continue Advair daily with PRN albuterol Return here for Prevnar-20 in 2-3 months - albuterol (VENTOLIN HFA) 108 (90 Base) MCG/ACT inhaler; Inhale 2 puffs into the lungs every 6 (six) hours as needed for wheezing or shortness of breath.  Dispense: 18 g; Refill: 1  5. Primary osteoarthritis of right hip Planning THA in December.   Partially dictated using January. Any errors are unintentional.  Animal nutritionist, MD [facility]  05/16/2021

## 2021-05-17 ENCOUNTER — Other Ambulatory Visit: Payer: Self-pay

## 2021-05-17 ENCOUNTER — Encounter: Payer: Self-pay | Admitting: Internal Medicine

## 2021-05-17 ENCOUNTER — Telehealth: Payer: Self-pay

## 2021-05-17 DIAGNOSIS — E785 Hyperlipidemia, unspecified: Secondary | ICD-10-CM | POA: Insufficient documentation

## 2021-05-17 DIAGNOSIS — Z1211 Encounter for screening for malignant neoplasm of colon: Secondary | ICD-10-CM

## 2021-05-17 LAB — LIPID PANEL
Chol/HDL Ratio: 3.5 ratio (ref 0.0–4.4)
Cholesterol, Total: 221 mg/dL — ABNORMAL HIGH (ref 100–199)
HDL: 64 mg/dL (ref >39)
LDL Chol Calc (NIH): 138 mg/dL — ABNORMAL HIGH (ref 0–99)
Triglycerides: 109 mg/dL (ref 0–149)
VLDL Cholesterol Cal: 19 mg/dL (ref 5–40)

## 2021-05-17 LAB — COMPREHENSIVE METABOLIC PANEL
ALT: 12 IU/L (ref 0–32)
AST: 13 IU/L (ref 0–40)
Albumin/Globulin Ratio: 2 (ref 1.2–2.2)
Albumin: 4.5 g/dL (ref 3.8–4.9)
Alkaline Phosphatase: 102 IU/L (ref 44–121)
BUN/Creatinine Ratio: 16 (ref 12–28)
BUN: 11 mg/dL (ref 8–27)
Bilirubin Total: 0.3 mg/dL (ref 0.0–1.2)
CO2: 22 mmol/L (ref 20–29)
Calcium: 9.4 mg/dL (ref 8.7–10.3)
Chloride: 104 mmol/L (ref 96–106)
Creatinine, Ser: 0.68 mg/dL (ref 0.57–1.00)
Globulin, Total: 2.3 g/dL (ref 1.5–4.5)
Glucose: 87 mg/dL (ref 65–99)
Potassium: 4.3 mmol/L (ref 3.5–5.2)
Sodium: 140 mmol/L (ref 134–144)
Total Protein: 6.8 g/dL (ref 6.0–8.5)
eGFR: 100 mL/min/{1.73_m2} (ref >59)

## 2021-05-17 LAB — HEMOGLOBIN A1C
Est. average glucose Bld gHb Est-mCnc: 126 mg/dL
Hgb A1c MFr Bld: 6 % — ABNORMAL HIGH (ref 4.8–5.6)

## 2021-05-17 LAB — CBC WITH DIFFERENTIAL/PLATELET
Basophils Absolute: 0.1 10*3/uL (ref 0.0–0.2)
Basos: 1 %
EOS (ABSOLUTE): 0.1 10*3/uL (ref 0.0–0.4)
Eos: 2 %
Hematocrit: 40.7 % (ref 34.0–46.6)
Hemoglobin: 13.8 g/dL (ref 11.1–15.9)
Immature Grans (Abs): 0 10*3/uL (ref 0.0–0.1)
Immature Granulocytes: 0 %
Lymphocytes Absolute: 2.8 10*3/uL (ref 0.7–3.1)
Lymphs: 44 %
MCH: 30.4 pg (ref 26.6–33.0)
MCHC: 33.9 g/dL (ref 31.5–35.7)
MCV: 90 fL (ref 79–97)
Monocytes Absolute: 0.4 10*3/uL (ref 0.1–0.9)
Monocytes: 6 %
Neutrophils Absolute: 3 10*3/uL (ref 1.4–7.0)
Neutrophils: 47 %
Platelets: 337 10*3/uL (ref 150–450)
RBC: 4.54 x10E6/uL (ref 3.77–5.28)
RDW: 12.8 % (ref 11.7–15.4)
WBC: 6.4 10*3/uL (ref 3.4–10.8)

## 2021-05-17 LAB — TSH: TSH: 1.1 u[IU]/mL (ref 0.450–4.500)

## 2021-05-17 MED ORDER — NA SULFATE-K SULFATE-MG SULF 17.5-3.13-1.6 GM/177ML PO SOLN: 1.0000 | Freq: Once | ORAL | 0 refills | Status: AC

## 2021-05-17 MED ORDER — PEG 3350-KCL-NA BICARB-NACL 420 G PO SOLR: 4000.0000 mL | Freq: Once | ORAL | 0 refills | Status: AC

## 2021-05-17 NOTE — Progress Notes (Signed)
Patient requested a cheaper prep. Prep sent to pharmacy. 

## 2021-05-17 NOTE — Progress Notes (Addendum)
Gastroenterology Pre-Procedure Review  Request Date: 06/12/21 Requesting Physician: Dr. Maximino Greenland  PATIENT REVIEW QUESTIONS: The patient responded to the following health history questions as indicated:    1. Are you having any GI issues? no 2. Do you have a personal history of Polyps?  Last colonoscopy 10 years ago. No polyps 3. Do you have a family history of Colon Cancer or Polyps? no 4. Diabetes Mellitus? no 5. Joint replacements in the past 12 months? Yes, hip 6. Major health problems in the past 3 months?no 7. Any artificial heart valves, MVP, or defibrillator?no    MEDICATIONS & ALLERGIES:    Patient reports the following regarding taking any anticoagulation/antiplatelet therapy:   Plavix, Coumadin, Eliquis, Xarelto, Lovenox, Pradaxa, Brilinta, or Effient? no Aspirin? no  Patient confirms/reports the following medications:  Current Outpatient Medications  Medication Sig Dispense Refill   acetaminophen (TYLENOL) 650 MG CR tablet Take 650 mg by mouth every 8 (eight) hours as needed for pain.     albuterol (VENTOLIN HFA) 108 (90 Base) MCG/ACT inhaler Inhale 2 puffs into the lungs every 6 (six) hours as needed for wheezing or shortness of breath. 18 g 1   WIXELA INHUB 250-50 MCG/ACT AEPB USE 1 INHALATION TWICE A DAY (MUST SCHEDULE OFFICE VISIT PRIOR TO FURTHER REFILLS) 180 each 3   No current facility-administered medications for this visit.    Patient confirms/reports the following allergies:  Allergies  Allergen Reactions   Tramadol Other (See Comments)    MIGRAINES   Aspirin Diarrhea   Naproxen Diarrhea    No orders of the defined types were placed in this encounter.   AUTHORIZATION INFORMATION Primary Insurance: 1D#: Group #:  Secondary Insurance: 1D#: Group #:  SCHEDULE INFORMATION: Date: 06/12/21 Time: Location: [facility]

## 2021-05-17 NOTE — Telephone Encounter (Signed)
Pt. Calling to cancel upcoming procedure for 06/12/21

## 2021-05-17 NOTE — Telephone Encounter (Signed)
Patient will hold off cancelling procedure at this time. Will wait to see how much the cheaper prep will cost.

## 2021-06-12 ENCOUNTER — Ambulatory Visit: Admit: 2021-06-12 | Admitting: Gastroenterology

## 2021-06-12 SURGERY — COLONOSCOPY WITH PROPOFOL
Anesthesia: Choice

## 2021-12-05 ENCOUNTER — Ambulatory Visit: Admitting: Internal Medicine

## 2021-12-05 ENCOUNTER — Other Ambulatory Visit: Payer: Self-pay

## 2021-12-10 ENCOUNTER — Encounter: Payer: Self-pay | Admitting: Internal Medicine

## 2021-12-10 ENCOUNTER — Ambulatory Visit (INDEPENDENT_AMBULATORY_CARE_PROVIDER_SITE_OTHER): Admitting: Internal Medicine

## 2021-12-10 VITALS — BP 128/70 | HR 69 | Ht 62.0 in | Wt 163.2 lb

## 2021-12-10 DIAGNOSIS — J4530 Mild persistent asthma, uncomplicated: Secondary | ICD-10-CM

## 2021-12-10 DIAGNOSIS — M25551 Pain in right hip: Secondary | ICD-10-CM | POA: Diagnosis not present

## 2021-12-10 DIAGNOSIS — Z90710 Acquired absence of both cervix and uterus: Secondary | ICD-10-CM | POA: Insufficient documentation

## 2021-12-10 MED ORDER — ALBUTEROL SULFATE HFA 108 (90 BASE) MCG/ACT IN AERS: 2.0000 | INHALATION_SPRAY | Freq: Four times a day (QID) | RESPIRATORY_TRACT | 1 refills | Status: DC | PRN

## 2021-12-10 NOTE — Progress Notes (Signed)
? ? ?Date:  12/10/2021  ? ?Name:  Tamara George   DOB:  04/13/1961   MRN:  [id number] ? ? ?Chief Complaint: Abdominal Pain ? ?Hip Pain  ?Incident onset: started about 2 weeks ago and progressing. There was no injury mechanism. The pain is present in the right hip. The quality of the pain is described as aching and shooting. The pain is moderate. The pain has been Constant since onset. Associated symptoms include an inability to bear weight. The symptoms are aggravated by movement, palpation and weight bearing. She has tried NSAIDs for the symptoms. The treatment provided mild relief.  ? ?Lab Results  ?Component Value Date  ? NA 140 05/16/2021  ? K 4.3 05/16/2021  ? CO2 22 05/16/2021  ? GLUCOSE 87 05/16/2021  ? BUN 11 05/16/2021  ? CREATININE 0.68 05/16/2021  ? CALCIUM 9.4 05/16/2021  ? EGFR 100 05/16/2021  ? GFRNONAA 93 11/12/2019  ? ?Lab Results  ?Component Value Date  ? CHOL 221 (H) 05/16/2021  ? HDL 64 05/16/2021  ? LDLCALC 138 (H) 05/16/2021  ? TRIG 109 05/16/2021  ? CHOLHDL 3.5 05/16/2021  ? ?Lab Results  ?Component Value Date  ? TSH 1.100 05/16/2021  ? ?Lab Results  ?Component Value Date  ? HGBA1C 6.0 (H) 05/16/2021  ? ?Lab Results  ?Component Value Date  ? WBC 6.4 05/16/2021  ? HGB 13.8 05/16/2021  ? HCT 40.7 05/16/2021  ? MCV 90 05/16/2021  ? PLT 337 05/16/2021  ? ?Lab Results  ?Component Value Date  ? ALT 12 05/16/2021  ? AST 13 05/16/2021  ? ALKPHOS 102 05/16/2021  ? BILITOT 0.3 05/16/2021  ? ?Lab Results  ?Component Value Date  ? VD25OH 20.3 02/15/2016  ?  ? ?Review of Systems  ?Constitutional:  Negative for chills, fatigue and fever.  ?Respiratory:  Positive for cough and wheezing. Negative for chest tightness and shortness of breath.   ?Cardiovascular:  Negative for chest pain.  ?Gastrointestinal:  Negative for abdominal pain, constipation, diarrhea and vomiting.  ?Musculoskeletal:  Positive for arthralgias and gait problem.  ?Allergic/Immunologic: Positive for environmental allergies.   ?Psychiatric/Behavioral:  Negative for dysphoric mood and sleep disturbance. The patient is not nervous/anxious.   ? ?Patient Active Problem List  ? Diagnosis Date Noted  ? S/P total hysterectomy 12/10/2021  ? Mild hyperlipidemia 05/17/2021  ? History of total hip arthroplasty 08/22/2020  ? Tobacco use disorder 11/12/2019  ? Arthropathy of both temporomandibular joints 11/12/2019  ? Corn of foot 11/12/2019  ? Generalized anxiety disorder 11/12/2019  ? Mild persistent asthma without complication 11/11/2019  ? Localized, primary osteoarthritis of shoulder region 05/17/2019  ? ? ?Allergies  ?Allergen Reactions  ? Tramadol Other (See Comments)  ?  MIGRAINES  ? Aspirin Diarrhea  ? Naproxen Diarrhea  ? ? ?Past Surgical History:  ?Procedure Laterality Date  ? ABDOMINAL HYSTERECTOMY  12/209  ? partial- still have cervix and need paps - endometriosis  ? CARPAL TUNNEL RELEASE Right   ? CATARACT EXTRACTION, BILATERAL Bilateral   ? FRACTURE SURGERY Right   ? tib-fib  ? HERNIA REPAIR Left   ? Inguinal  ? REPEAT CESAREAN SECTION    ? TOTAL KNEE ARTHROPLASTY Right 2013  ? ? ?Social History  ? ?Tobacco Use  ? Smoking status: Some Days  ?  Packs/day: 0.15  ?  Years: 10.00  ?  Pack years: 1.50  ?  Types: Cigarettes  ? Smokeless tobacco: Never  ? Tobacco comments:  ?  4 cigs daily- trying   to quit - 2022  ?Vaping Use  ? Vaping Use: Never used  ?Substance Use Topics  ? Alcohol use: Never  ? Drug use: Never  ? ? ? ?Medication list has been reviewed and updated. ? ?Current Meds  ?Medication Sig  ? acetaminophen (TYLENOL) 650 MG CR tablet Take 650 mg by mouth every 8 (eight) hours as needed for pain.  ? albuterol (VENTOLIN HFA) 108 (90 Base) MCG/ACT inhaler Inhale 2 puffs into the lungs every 6 (six) hours as needed for wheezing or shortness of breath.  ? WIXELA INHUB 250-50 MCG/ACT AEPB USE 1 INHALATION TWICE A DAY (MUST SCHEDULE OFFICE VISIT PRIOR TO FURTHER REFILLS)  ? ? ? ?  12/10/2021  ?  3:00 PM 05/16/2021  ? 10:54 AM 05/09/2020  ?   3:49 PM 11/12/2019  ?  9:15 AM  ?GAD 7 : Generalized Anxiety Score  ?Nervous, Anxious, on Edge 0 0 0 3  ?Control/stop worrying 0 1 0 0  ?Worry too much - different things 0 1 0 3  ?Trouble relaxing 0 1 0 3  ?Restless 0 0 0 3  ?Easily annoyed or irritable 0 0 0 0  ?Afraid - awful might happen 0 0 0 2  ?Total GAD 7 Score 0 3 0 14  ?Anxiety Difficulty Not difficult at all   Not difficult at all  ? ? ? ?  12/10/2021  ?  3:00 PM  ?Depression screen PHQ 2/9  ?Decreased Interest 0  ?Down, Depressed, Hopeless 0  ?PHQ - 2 Score 0  ?Altered sleeping 0  ?Tired, decreased energy 0  ?Change in appetite 0  ?Feeling bad or failure about yourself  0  ?Trouble concentrating 0  ?Moving slowly or fidgety/restless 0  ?Suicidal thoughts 0  ?PHQ-9 Score 0  ?Difficult doing work/chores Not difficult at all  ? ? ?BP Readings from Last 3 Encounters:  ?12/10/21 128/70  ?05/16/21 110/68  ?05/09/20 120/70  ? ? ?Physical Exam ?Vitals and nursing note reviewed.  ?Constitutional:   ?   General: She is in acute distress.  ?   Appearance: She is well-developed.  ?HENT:  ?   Head: Normocephalic and atraumatic.  ?Pulmonary:  ?   Effort: Pulmonary effort is normal. No respiratory distress.  ?   Breath sounds: No wheezing.  ?Abdominal:  ?   General: Abdomen is flat. Bowel sounds are normal. There is no distension or abdominal bruit.  ?   Palpations: Abdomen is soft.  ?   Tenderness: There is no abdominal tenderness.  ?   Hernia: No hernia is present.  ?Musculoskeletal:  ?   Right hip: Tenderness (anterior hip joint) present. Decreased range of motion.  ?Skin: ?   General: Skin is warm and dry.  ?   Findings: No rash.  ?Neurological:  ?   Mental Status: She is alert and oriented to person, place, and time.  ?Psychiatric:     ?   Mood and Affect: Mood normal.     ?   Behavior: Behavior normal.  ? ? ?Wt Readings from Last 3 Encounters:  ?12/10/21 163 lb 3.2 oz (74 kg)  ?05/16/21 155 lb (70.3 kg)  ?05/09/20 154 lb (69.9 kg)  ? ? ?BP 128/70   Pulse 69   Ht  5' 2" (1.575 m)   Wt 163 lb 3.2 oz (74 kg)   SpO2 97%   BMI 29.85 kg/m?  ? ?Assessment and Plan: ?1. Pain of right hip ?Concern that there may be infection or   instability of the right prosthesis. ?Will get screening labs; limit ambulation, continue Advil ?Contact Dr. Bowers today for urgent evaluation ?- CBC with Differential/Platelet ?- Sedimentation rate ?- CRP High sensitivity ? ?2. Mild persistent asthma without complication ?- albuterol (VENTOLIN HFA) 108 (90 Base) MCG/ACT inhaler; Inhale 2 puffs into the lungs every 6 (six) hours as needed for wheezing or shortness of breath.  Dispense: 18 g; Refill: 1 ? ? ?Partially dictated using Dragon software. Any errors are unintentional. ? ?Laura Berglund, MD ?[facility] ?[facility] ? ?12/10/2021 ? ? ? ? ? ?

## 2021-12-11 LAB — CBC WITH DIFFERENTIAL/PLATELET
Basophils Absolute: 0.1 10*3/uL (ref 0.0–0.2)
Basos: 1 %
EOS (ABSOLUTE): 0.2 10*3/uL (ref 0.0–0.4)
Eos: 3 %
Hematocrit: 38.4 % (ref 34.0–46.6)
Hemoglobin: 13.1 g/dL (ref 11.1–15.9)
Immature Grans (Abs): 0 10*3/uL (ref 0.0–0.1)
Immature Granulocytes: 0 %
Lymphocytes Absolute: 3.2 10*3/uL — ABNORMAL HIGH (ref 0.7–3.1)
Lymphs: 47 %
MCH: 29.4 pg (ref 26.6–33.0)
MCHC: 34.1 g/dL (ref 31.5–35.7)
MCV: 86 fL (ref 79–97)
Monocytes Absolute: 0.5 10*3/uL (ref 0.1–0.9)
Monocytes: 8 %
Neutrophils Absolute: 2.7 10*3/uL (ref 1.4–7.0)
Neutrophils: 41 %
Platelets: 338 10*3/uL (ref 150–450)
RBC: 4.45 x10E6/uL (ref 3.77–5.28)
RDW: 13.3 % (ref 11.7–15.4)
WBC: 6.7 10*3/uL (ref 3.4–10.8)

## 2021-12-11 LAB — SEDIMENTATION RATE: Sed Rate: 6 mm/hr (ref 0–40)

## 2021-12-11 LAB — HIGH SENSITIVITY CRP: CRP, High Sensitivity: 4.49 mg/L — ABNORMAL HIGH (ref 0.00–3.00)

## 2022-03-07 ENCOUNTER — Other Ambulatory Visit: Payer: Self-pay | Admitting: Internal Medicine

## 2022-03-07 DIAGNOSIS — J4530 Mild persistent asthma, uncomplicated: Secondary | ICD-10-CM

## 2022-03-07 NOTE — Telephone Encounter (Signed)
Requested medication (s) are due for refill today:   Yes  Requested medication (s) are on the active medication list:   Yes  Future visit scheduled:   Yes 05/17/2022   Last ordered: 03/12/2021 #180, 3 refills  Returned because wasn't sure if Dr. Judithann Graves wanted to see pt prior to appt. In Sept. Or not.  There is a note that she must schedule OV prior to further refills.     Requested Prescriptions  Pending Prescriptions Disp Refills   WIXELA INHUB 250-50 MCG/ACT AEPB [Pharmacy Med Name: WIXELA INHUB 60'S 250/50] 180 each 3    Sig: USE 1 INHALATION TWICE A DAY (MUST SCHEDULE OFFICE VISIT PRIOR TO FURTHER REFILLS)     Pulmonology:  Combination Products Passed - 03/07/2022  3:29 AM      Passed - Valid encounter within last 12 months    Recent Outpatient Visits           2 months ago Pain of right hip   [facility] Reubin Milan, MD   9 months ago Annual physical exam   [facility] Reubin Milan, MD   1 year ago Primary osteoarthritis of left hip   [facility] Reubin Milan, MD   2 years ago Annual physical exam   [facility] Reubin Milan, MD   2 years ago Chronic left shoulder pain   [facility] Reubin Milan, MD       Future Appointments             In 2 months Judithann Graves Nyoka Cowden, MD [facility]

## 2022-05-17 ENCOUNTER — Ambulatory Visit (INDEPENDENT_AMBULATORY_CARE_PROVIDER_SITE_OTHER): Admitting: Internal Medicine

## 2022-05-17 ENCOUNTER — Encounter: Payer: Self-pay | Admitting: Internal Medicine

## 2022-05-17 VITALS — BP 120/62 | HR 63 | Ht 62.0 in | Wt 171.0 lb

## 2022-05-17 DIAGNOSIS — N6452 Nipple discharge: Secondary | ICD-10-CM

## 2022-05-17 DIAGNOSIS — Z0000 Encounter for general adult medical examination without abnormal findings: Secondary | ICD-10-CM

## 2022-05-17 DIAGNOSIS — R7303 Prediabetes: Secondary | ICD-10-CM | POA: Diagnosis not present

## 2022-05-17 DIAGNOSIS — Z1211 Encounter for screening for malignant neoplasm of colon: Secondary | ICD-10-CM

## 2022-05-17 DIAGNOSIS — J4530 Mild persistent asthma, uncomplicated: Secondary | ICD-10-CM | POA: Diagnosis not present

## 2022-05-17 DIAGNOSIS — E785 Hyperlipidemia, unspecified: Secondary | ICD-10-CM | POA: Diagnosis not present

## 2022-05-17 NOTE — Progress Notes (Signed)
Date:  05/17/2022   Name:  Tamara George   DOB:  1961-07-23   MRN:  [id number]   Chief Complaint: Annual Exam Tamara George is a 61 y.o. female who presents today for her Complete Annual Exam. She feels well. She reports exercising- working. She reports she is sleeping poorly. Breast complaints - left breat soreness, brown discharge coming from nipple X 2 months. Has hx of breast cyst and biopsy about 20 yr ago.  Mammogram: 12/2017 DEXA: none Pap smear: 10/2017 neg/neg Colonoscopy: 2012 in [place] - scheduled last year but cancelled  Health Maintenance Due  Topic Date Due   HIV Screening  Never done   Zoster Vaccines- Shingrix (1 of 2) Never done   COLONOSCOPY (Pts 45-3yr Insurance coverage will need to be confirmed)  Never done   MAMMOGRAM  11/01/2019    Immunization History  Administered Date(s) Administered   Influenza,inj,Quad PF,6+ Mos 05/09/2020, 05/16/2021   PFIZER Comirnaty(Gray Top)Covid-19 Tri-Sucrose Vaccine 06/21/2020, 07/12/2020   Tdap 05/09/2020    Asthma There is no cough, shortness of breath or wheezing. This is a recurrent problem. The problem occurs intermittently. Pertinent negatives include no chest pain, fever, headaches or trouble swallowing. Her symptoms are alleviated by beta-agonist and steroid inhaler. She reports significant improvement on treatment. Her past medical history is significant for asthma.    Lab Results  Component Value Date   NA 140 05/16/2021   K 4.3 05/16/2021   CO2 22 05/16/2021   GLUCOSE 87 05/16/2021   BUN 11 05/16/2021   CREATININE 0.68 05/16/2021   CALCIUM 9.4 05/16/2021   EGFR 100 05/16/2021   GFRNONAA 93 11/12/2019   Lab Results  Component Value Date   CHOL 221 (H) 05/16/2021   HDL 64 05/16/2021   LDLCALC 138 (H) 05/16/2021   TRIG 109 05/16/2021   CHOLHDL 3.5 05/16/2021   Lab Results  Component Value Date   TSH 1.100 05/16/2021   Lab Results  Component Value Date   HGBA1C 6.0 (H) 05/16/2021   Lab Results   Component Value Date   WBC 6.7 12/10/2021   HGB 13.1 12/10/2021   HCT 38.4 12/10/2021   MCV 86 12/10/2021   PLT 338 12/10/2021   Lab Results  Component Value Date   ALT 12 05/16/2021   AST 13 05/16/2021   ALKPHOS 102 05/16/2021   BILITOT 0.3 05/16/2021   Lab Results  Component Value Date   VD25OH 20.3 02/15/2016     Review of Systems  Constitutional:  Negative for chills, fatigue and fever.  HENT:  Negative for congestion, hearing loss, tinnitus, trouble swallowing and voice change.   Eyes:  Negative for visual disturbance.  Respiratory:  Negative for cough, chest tightness, shortness of breath and wheezing.   Cardiovascular:  Negative for chest pain, palpitations and leg swelling.  Gastrointestinal:  Negative for abdominal pain, constipation, diarrhea and vomiting.  Endocrine: Negative for polydipsia and polyuria.  Genitourinary:  Negative for dysuria, frequency, genital sores, vaginal bleeding and vaginal discharge.       Left breast tenderness and nipple discharge  Musculoskeletal:  Negative for arthralgias, gait problem and joint swelling.  Skin:  Negative for color change and rash.  Neurological:  Negative for dizziness, tremors, light-headedness and headaches.  Hematological:  Negative for adenopathy. Does not bruise/bleed easily.  Psychiatric/Behavioral:  Negative for dysphoric mood and sleep disturbance. The patient is not nervous/anxious.     Patient Active Problem List   Diagnosis Date Noted   S/P total hysterectomy  12/10/2021   Mild hyperlipidemia 05/17/2021   History of total hip arthroplasty 08/22/2020   Tobacco use disorder 11/12/2019   Arthropathy of both temporomandibular joints 11/12/2019   Corn of foot 11/12/2019   Generalized anxiety disorder 11/12/2019   Mild persistent asthma without complication 62/26/3335   Localized, primary osteoarthritis of shoulder region 05/17/2019    Allergies  Allergen Reactions   Tramadol Other (See Comments)     MIGRAINES   Aspirin Diarrhea   Naproxen Diarrhea    Past Surgical History:  Procedure Laterality Date   ABDOMINAL HYSTERECTOMY  12/209   partial- still has cervix and need paps - endometriosis   CARPAL TUNNEL RELEASE Right    CATARACT EXTRACTION, BILATERAL Bilateral    FRACTURE SURGERY Right    tib-fib   HERNIA REPAIR Left    Inguinal   REPEAT CESAREAN SECTION     TOTAL KNEE ARTHROPLASTY Right 2013    Social History   Tobacco Use   Smoking status: Some Days    Packs/day: 0.15    Years: 10.00    Total pack years: 1.50    Types: Cigarettes   Smokeless tobacco: Never   Tobacco comments:    3 cigs daily- trying to quit - 2023  Vaping Use   Vaping Use: Never used  Substance Use Topics   Alcohol use: Never   Drug use: Never     Medication list has been reviewed and updated.  Current Meds  Medication Sig   acetaminophen (TYLENOL) 650 MG CR tablet Take 650 mg by mouth every 8 (eight) hours as needed for pain.   albuterol (VENTOLIN HFA) 108 (90 Base) MCG/ACT inhaler Inhale 2 puffs into the lungs every 6 (six) hours as needed for wheezing or shortness of breath.   WIXELA INHUB 250-50 MCG/ACT AEPB USE 1 INHALATION TWICE A DAY (MUST SCHEDULE OFFICE VISIT PRIOR TO FURTHER REFILLS)       05/17/2022   11:10 AM 12/10/2021    3:00 PM 05/16/2021   10:54 AM 05/09/2020    3:49 PM  GAD 7 : Generalized Anxiety Score  Nervous, Anxious, on Edge 0 0 0 0  Control/stop worrying 0 0 1 0  Worry too much - different things 0 0 1 0  Trouble relaxing 0 0 1 0  Restless 0 0 0 0  Easily annoyed or irritable 0 0 0 0  Afraid - awful might happen 0 0 0 0  Total GAD 7 Score 0 0 3 0  Anxiety Difficulty Not difficult at all Not difficult at all         05/17/2022   11:10 AM 12/10/2021    3:00 PM 05/16/2021   10:53 AM  Depression screen PHQ 2/9  Decreased Interest 0 0 0  Down, Depressed, Hopeless 0 0 0  PHQ - 2 Score 0 0 0  Altered sleeping 0 0 0  Tired, decreased energy 0 0 1  Change in  appetite 0 0 0  Feeling bad or failure about yourself  0 0 0  Trouble concentrating 0 0 0  Moving slowly or fidgety/restless 0 0 0  Suicidal thoughts 0 0 0  PHQ-9 Score 0 0 1  Difficult doing work/chores Not difficult at all Not difficult at all Not difficult at all    BP Readings from Last 3 Encounters:  05/17/22 120/62  12/10/21 128/70  05/16/21 110/68    Physical Exam Vitals and nursing note reviewed.  Constitutional:      General: She is not  in acute distress.    Appearance: She is well-developed.  HENT:     Head: Normocephalic and atraumatic.     Right Ear: Tympanic membrane and ear canal normal.     Left Ear: Tympanic membrane and ear canal normal.     Nose:     Right Sinus: No maxillary sinus tenderness.     Left Sinus: No maxillary sinus tenderness.  Eyes:     General: No scleral icterus.       Right eye: No discharge.        Left eye: No discharge.     Conjunctiva/sclera: Conjunctivae normal.  Neck:     Thyroid: No thyromegaly.     Vascular: No carotid bruit.  Cardiovascular:     Rate and Rhythm: Normal rate and regular rhythm.     Pulses: Normal pulses.     Heart sounds: Normal heart sounds.  Pulmonary:     Effort: Pulmonary effort is normal. No respiratory distress.     Breath sounds: No wheezing.  Chest:  Breasts:    Right: No mass, nipple discharge, skin change or tenderness.     Left: Nipple discharge present. No mass, skin change or tenderness.     Comments: Left breast tender Scant yellow/brown discharge from left nipple Abdominal:     General: Bowel sounds are normal.     Palpations: Abdomen is soft.     Tenderness: There is no abdominal tenderness.  Musculoskeletal:     Cervical back: Normal range of motion. No erythema.     Right lower leg: No edema.     Left lower leg: No edema.  Lymphadenopathy:     Cervical: No cervical adenopathy.  Skin:    General: Skin is warm and dry.     Findings: No rash.  Neurological:     Mental Status: She  is alert and oriented to person, place, and time.     Cranial Nerves: No cranial nerve deficit.     Sensory: No sensory deficit.     Deep Tendon Reflexes: Reflexes are normal and symmetric.  Psychiatric:        Attention and Perception: Attention normal.        Mood and Affect: Mood normal.     Wt Readings from Last 3 Encounters:  05/17/22 171 lb (77.6 kg)  12/10/21 163 lb 3.2 oz (74 kg)  05/16/21 155 lb (70.3 kg)    BP 120/62   Pulse 63   Ht '5\' 2"'  (1.575 m)   Wt 171 lb (77.6 kg)   SpO2 96%   BMI 31.28 kg/m   Assessment and Plan: 1. Annual physical exam Exam is normal except for weight. Encourage regular exercise and appropriate dietary changes. Should be able to increase physical activity since hip replacement. - CBC with Differential/Platelet - Comprehensive metabolic panel - Hemoglobin A1c - Lipid panel - TSH  2. Discharge from breast She is overdue for mammogram and will need Dx mammo - MM DIAG BREAST TOMO BILATERAL - US BREAST LTD UNI RIGHT INC AXILLA - US BREAST LTD UNI LEFT INC AXILLA  3. Colon cancer screening Cancelled last year - overdue for CRC screening - Ambulatory referral to Gastroenterology  4. Prediabetes Continue low carb diet  - Comprehensive metabolic panel - Hemoglobin A1c  5. Mild hyperlipidemia Check labs and advise if medications are indicated - Lipid panel  6. Mild persistent asthma without complication Due for [id number] and flu - patient declines Continue Advair bid Asthma under good control.  Partially dictated using Editor, commissioning. Any errors are unintentional.  Halina Maidens, MD [facility]  05/17/2022

## 2022-05-31 ENCOUNTER — Telehealth: Payer: Self-pay

## 2022-05-31 NOTE — Telephone Encounter (Signed)
Called pt as a reminder to get labs done. Pt verbalized understanding.   KP 

## 2022-07-11 ENCOUNTER — Other Ambulatory Visit: Payer: Self-pay | Admitting: Internal Medicine

## 2022-07-11 DIAGNOSIS — J4530 Mild persistent asthma, uncomplicated: Secondary | ICD-10-CM

## 2022-07-11 MED ORDER — ALBUTEROL SULFATE HFA 108 (90 BASE) MCG/ACT IN AERS: 2.0000 | INHALATION_SPRAY | Freq: Four times a day (QID) | RESPIRATORY_TRACT | 2 refills | Status: DC | PRN

## 2022-07-11 NOTE — Telephone Encounter (Signed)
Requested Prescriptions  Pending Prescriptions Disp Refills   albuterol (VENTOLIN HFA) 108 (90 Base) MCG/ACT inhaler 18 g 3    Sig: Inhale 2 puffs into the lungs every 6 (six) hours as needed for wheezing or shortness of breath.     Pulmonology:  Beta Agonists 2 Passed - 07/11/2022 10:35 AM      Passed - Last BP in normal range    BP Readings from Last 1 Encounters:  05/17/22 120/62         Passed - Last Heart Rate in normal range    Pulse Readings from Last 1 Encounters:  05/17/22 63         Passed - Valid encounter within last 12 months    Recent Outpatient Visits           1 month ago Annual physical exam   [place] [facility], Nyoka Cowden, MD   7 months ago Pain of right hip   [place] [facility], Nyoka Cowden, MD   1 year ago Annual physical exam   [place] [facility], Nyoka Cowden, MD   2 years ago Primary osteoarthritis of left hip   [place] [facility] at [facility], Nyoka Cowden, MD   2 years ago Annual physical exam   [facility] at [facility], Nyoka Cowden, MD       Future Appointments             In 10 months Judithann Graves Nyoka Cowden, MD [facility] at [facility]

## 2022-07-11 NOTE — Telephone Encounter (Signed)
Medication Refill - Medication: albuterol (VENTOLIN HFA) 108 (90 Base) MCG/ACT inhaler   Has the patient contacted their pharmacy? No. (Agent: If no, request that the patient contact the pharmacy for the refill. If patient does not wish to contact the pharmacy document the reason why and proceed with request.) (Agent: If yes, when and what did the pharmacy advise?)  Preferred Pharmacy (with phone number or street name):  [id number] [place], [place] - [address] Phone: [phone]  Fax: [phone]     Has the patient been seen for an appointment in the last year OR does the patient have an upcoming appointment? Yes.    Agent: Please be advised that RX refills may take up to 3 business days. We ask that you follow-up with your pharmacy.

## 2022-07-31 LAB — LIPID PANEL
Chol/HDL Ratio: 3.4 ratio (ref 0.0–4.4)
Cholesterol, Total: 236 mg/dL — ABNORMAL HIGH (ref 100–199)
HDL: 70 mg/dL (ref >39)
LDL Chol Calc (NIH): 155 mg/dL — ABNORMAL HIGH (ref 0–99)
Triglycerides: 65 mg/dL (ref 0–149)
VLDL Cholesterol Cal: 11 mg/dL (ref 5–40)

## 2022-07-31 LAB — CBC WITH DIFFERENTIAL/PLATELET
Basophils Absolute: 0.1 10*3/uL (ref 0.0–0.2)
Basos: 1 %
EOS (ABSOLUTE): 0.2 10*3/uL (ref 0.0–0.4)
Eos: 2 %
Hematocrit: 38.6 % (ref 34.0–46.6)
Hemoglobin: 12.9 g/dL (ref 11.1–15.9)
Immature Grans (Abs): 0 10*3/uL (ref 0.0–0.1)
Immature Granulocytes: 0 %
Lymphocytes Absolute: 2.9 10*3/uL (ref 0.7–3.1)
Lymphs: 44 %
MCH: 29.9 pg (ref 26.6–33.0)
MCHC: 33.4 g/dL (ref 31.5–35.7)
MCV: 89 fL (ref 79–97)
Monocytes Absolute: 0.5 10*3/uL (ref 0.1–0.9)
Monocytes: 8 %
Neutrophils Absolute: 3 10*3/uL (ref 1.4–7.0)
Neutrophils: 45 %
Platelets: 323 10*3/uL (ref 150–450)
RBC: 4.32 x10E6/uL (ref 3.77–5.28)
RDW: 12.8 % (ref 11.7–15.4)
WBC: 6.6 10*3/uL (ref 3.4–10.8)

## 2022-07-31 LAB — COMPREHENSIVE METABOLIC PANEL
ALT: 19 IU/L (ref 0–32)
AST: 15 IU/L (ref 0–40)
Albumin/Globulin Ratio: 1.9 (ref 1.2–2.2)
Albumin: 4.4 g/dL (ref 3.9–4.9)
Alkaline Phosphatase: 103 IU/L (ref 44–121)
BUN/Creatinine Ratio: 17 (ref 12–28)
BUN: 12 mg/dL (ref 8–27)
Bilirubin Total: 0.3 mg/dL (ref 0.0–1.2)
CO2: 21 mmol/L (ref 20–29)
Calcium: 9.4 mg/dL (ref 8.7–10.3)
Chloride: 107 mmol/L — ABNORMAL HIGH (ref 96–106)
Creatinine, Ser: 0.69 mg/dL (ref 0.57–1.00)
Globulin, Total: 2.3 g/dL (ref 1.5–4.5)
Glucose: 89 mg/dL (ref 70–99)
Potassium: 4.4 mmol/L (ref 3.5–5.2)
Sodium: 144 mmol/L (ref 134–144)
Total Protein: 6.7 g/dL (ref 6.0–8.5)
eGFR: 99 mL/min/{1.73_m2} (ref >59)

## 2022-07-31 LAB — HEMOGLOBIN A1C
Est. average glucose Bld gHb Est-mCnc: 123 mg/dL
Hgb A1c MFr Bld: 5.9 % — ABNORMAL HIGH (ref 4.8–5.6)

## 2022-07-31 LAB — TSH: TSH: 0.771 u[IU]/mL (ref 0.450–4.500)

## 2022-08-15 ENCOUNTER — Other Ambulatory Visit: Payer: Self-pay | Admitting: *Deleted

## 2022-08-15 ENCOUNTER — Inpatient Hospital Stay
Admission: RE | Admit: 2022-08-15 | Discharge: 2022-08-15 | Disposition: A | Discharge status: Home / Self Care | Payer: Self-pay | Source: Ambulatory Visit | Attending: *Deleted | Admitting: *Deleted

## 2022-08-15 DIAGNOSIS — Z1231 Encounter for screening mammogram for malignant neoplasm of breast: Secondary | ICD-10-CM

## 2022-08-16 ENCOUNTER — Encounter: Payer: Self-pay | Admitting: Internal Medicine

## 2022-08-16 ENCOUNTER — Ambulatory Visit: Payer: Self-pay

## 2022-08-16 ENCOUNTER — Ambulatory Visit: Admitting: Internal Medicine

## 2022-08-16 VITALS — BP 104/76 | Ht 62.0 in | Wt 177.0 lb

## 2022-08-16 DIAGNOSIS — J4530 Mild persistent asthma, uncomplicated: Secondary | ICD-10-CM | POA: Diagnosis not present

## 2022-08-16 DIAGNOSIS — J45901 Unspecified asthma with (acute) exacerbation: Secondary | ICD-10-CM | POA: Diagnosis not present

## 2022-08-16 MED ORDER — AZITHROMYCIN 250 MG PO TABS: ORAL_TABLET | ORAL | 0 refills | Status: AC

## 2022-08-16 MED ORDER — FLUTICASONE-SALMETEROL 500-50 MCG/ACT IN AEPB: 1.0000 | INHALATION_SPRAY | Freq: Two times a day (BID) | RESPIRATORY_TRACT | 5 refills | Status: DC

## 2022-08-16 MED ORDER — PREDNISONE 10 MG PO TABS: ORAL_TABLET | ORAL | 0 refills | Status: AC

## 2022-08-16 MED ORDER — OMEPRAZOLE 40 MG PO CPDR: 40.0000 mg | DELAYED_RELEASE_CAPSULE | Freq: Every day | ORAL | 1 refills | Status: DC

## 2022-08-16 NOTE — Telephone Encounter (Signed)
    Chief Complaint: Asthma flare up Symptoms: Cough, wheezing. Worse when laying down Frequency: Last night Pertinent Negatives: Patient denies fever Disposition: [] ED /[] Urgent Care (no appt availability in office) / [x] Appointment(In office/virtual)/ []  [place] [facility]/ [] Home Care/ [] Refused Recommended Disposition /[] [place] [facility]/ []  Follow-up with PCP Additional Notes: Go to ED for worsening of symptoms.  Answer Assessment - Initial Assessment Questions 1. RESPIRATORY STATUS: "Describe your breathing?" (e.g., wheezing, shortness of breath, unable to speak, severe coughing)      SOB, wheezing 2. ONSET: "When did this asthma attack begin?"      Last night 3. TRIGGER: "What do you think triggered this attack?" (e.g., URI, exposure to pollen or other allergen, tobacco smoke)      Ubsure 4. PEAK EXPIRATORY FLOW RATE (PEFR): "Do you use a peak flow meter?" If Yes, ask: "What's the current peak flow? What's your personal best peak flow?"      No 5. SEVERITY: "How bad is this attack?"    - MILD: No SOB at rest, mild SOB with walking, speaks normally in sentences, can lie down, no retractions, pulse < 100. (GREEN Zone: PEFR 80-100%)   - MODERATE: SOB at rest, SOB with minimal exertion and prefers to sit, cannot lie down flat, speaks in phrases, mild retractions, audible wheezing, pulse 100-120. (YELLOW Zone: PEFR 50-79%)    - SEVERE: Struggling for each breath, speaks in single words, struggling to breathe, sitting hunched forward, retractions, usually loud wheezing, sometimes minimal wheezing because of decreased air movement, pulse > 120. (RED Zone: PEFR < 50%).      Moderate 6. ASTHMA MEDICINES:  "What treatments have you tried?"    - INHALED QUICK RELIEF (RESCUE): "What is your inhaled quick-relief medicine?" (e.g., albuterol, salbutamol) "Do you use an inhaler or a nebulizer?" "How frequently have you been using this medicine?"   - CONTROLLER (LONG-TERM-CONTROL): "Do you  take an inhaled steroid? (e.g., Asmanex, Flovent, Pulmicort, Qvar)     Albuterol 7. INHALED QUICK-RELIEF TREATMENTS FOR THIS ATTACK: "What treatments have you given yourself so far?" and "How many and how often?" If using an inhaler, ask, "How many puffs?" Note: Routine treatments are 2 puffs every 4 hours as needed. Rescue treatments are 4 puffs repeated every 20 minutes, up to three times as needed.      Yes 8. OTHER SYMPTOMS: "Do you have any other symptoms? (e.g., chest pain, coughing up yellow sputum, fever, runny nose)     Coughing up mucus 9. O2 SATURATION MONITOR:  "Do you use an oxygen saturation monitor (pulse oximeter) at home?" If Yes, "What is your reading (oxygen level) today?" "What is your usual oxygen saturation reading?" (e.g., 95%)     No 10. PREGNANCY: "Is there any chance you are pregnant?" "When was your last menstrual period?"       No  Protocols used: Asthma Attack-A-AH

## 2022-08-16 NOTE — Progress Notes (Signed)
Date:  08/16/2022   Name:  Tamara George   DOB:  1961-08-17   MRN:  [id number]   Chief Complaint: Asthma  Asthma She complains of cough, shortness of breath and wheezing (nocturna symptoms and worse in AM). The current episode started more than 1 month ago (over past few months). Pertinent negatives include no chest pain, fever or headaches. Associated symptoms comments: Coughing white mucous, wheezing. Her symptoms are not alleviated by beta-agonist and steroid inhaler. Her past medical history is significant for asthma.    Lab Results  Component Value Date   NA 144 07/30/2022   K 4.4 07/30/2022   CO2 21 07/30/2022   GLUCOSE 89 07/30/2022   BUN 12 07/30/2022   CREATININE 0.69 07/30/2022   CALCIUM 9.4 07/30/2022   EGFR 99 07/30/2022   GFRNONAA 93 11/12/2019   Lab Results  Component Value Date   CHOL 236 (H) 07/30/2022   HDL 70 07/30/2022   LDLCALC 155 (H) 07/30/2022   TRIG 65 07/30/2022   CHOLHDL 3.4 07/30/2022   Lab Results  Component Value Date   TSH 0.771 07/30/2022   Lab Results  Component Value Date   HGBA1C 5.9 (H) 07/30/2022   Lab Results  Component Value Date   WBC 6.6 07/30/2022   HGB 12.9 07/30/2022   HCT 38.6 07/30/2022   MCV 89 07/30/2022   PLT 323 07/30/2022   Lab Results  Component Value Date   ALT 19 07/30/2022   AST 15 07/30/2022   ALKPHOS 103 07/30/2022   BILITOT 0.3 07/30/2022   Lab Results  Component Value Date   VD25OH 20.3 02/15/2016     Review of Systems  Constitutional:  Negative for chills, fatigue and fever.  Respiratory:  Positive for cough, shortness of breath and wheezing (nocturna symptoms and worse in AM).   Cardiovascular:  Negative for chest pain, palpitations and leg swelling.  Gastrointestinal:  Negative for abdominal pain, nausea and vomiting.  Neurological:  Negative for dizziness, light-headedness and headaches.  Psychiatric/Behavioral:  Negative for dysphoric mood and sleep disturbance. The patient is not  nervous/anxious.     Patient Active Problem List   Diagnosis Date Noted   S/P total hysterectomy 12/10/2021   Mild hyperlipidemia 05/17/2021   History of total hip arthroplasty 08/22/2020   Tobacco use disorder 11/12/2019   Arthropathy of both temporomandibular joints 11/12/2019   Corn of foot 11/12/2019   Generalized anxiety disorder 11/12/2019   Mild persistent asthma without complication 74/16/3845   Localized, primary osteoarthritis of shoulder region 05/17/2019    Allergies  Allergen Reactions   Tramadol Other (See Comments)    MIGRAINES   Aspirin Diarrhea   Naproxen Diarrhea    Past Surgical History:  Procedure Laterality Date   ABDOMINAL HYSTERECTOMY  12/209   partial- still has cervix and need paps - endometriosis   CARPAL TUNNEL RELEASE Right    CATARACT EXTRACTION, BILATERAL Bilateral    FRACTURE SURGERY Right    tib-fib   HERNIA REPAIR Left    Inguinal   REPEAT CESAREAN SECTION     TOTAL KNEE ARTHROPLASTY Right 2013    Social History   Tobacco Use   Smoking status: Some Days    Packs/day: 0.15    Years: 10.00    Total pack years: 1.50    Types: Cigarettes   Smokeless tobacco: Never   Tobacco comments:    3 cigs daily- trying to quit - 2023  Vaping Use   Vaping Use: Never used  Substance Use Topics   Alcohol use: Never   Drug use: Never     Medication list has been reviewed and updated.  Current Meds  Medication Sig   acetaminophen (TYLENOL) 650 MG CR tablet Take 650 mg by mouth every 8 (eight) hours as needed for pain.   albuterol (VENTOLIN HFA) 108 (90 Base) MCG/ACT inhaler Inhale 2 puffs into the lungs every 6 (six) hours as needed for wheezing or shortness of breath.   azithromycin (ZITHROMAX Z-PAK) 250 MG tablet UAD   fluticasone-salmeterol (WIXELA INHUB) 500-50 MCG/ACT AEPB Inhale 1 puff into the lungs in the morning and at bedtime.   omeprazole (PRILOSEC) 40 MG capsule Take 1 capsule (40 mg total) by mouth daily.   predniSONE  (DELTASONE) 10 MG tablet Take 6 tablets (60 mg total) by mouth daily with breakfast for 1 day, THEN 5 tablets (50 mg total) daily with breakfast for 1 day, THEN 4 tablets (40 mg total) daily with breakfast for 1 day, THEN 3 tablets (30 mg total) daily with breakfast for 1 day, THEN 2 tablets (20 mg total) daily with breakfast for 1 day, THEN 1 tablet (10 mg total) daily with breakfast for 1 day.   [DISCONTINUED] WIXELA INHUB 250-50 MCG/ACT AEPB USE 1 INHALATION TWICE A DAY (MUST SCHEDULE OFFICE VISIT PRIOR TO FURTHER REFILLS)       08/16/2022    1:47 PM 05/17/2022   11:10 AM 12/10/2021    3:00 PM 05/16/2021   10:54 AM  GAD 7 : Generalized Anxiety Score  Nervous, Anxious, on Edge 0 0 0 0  Control/stop worrying 0 0 0 1  Worry too much - different things 0 0 0 1  Trouble relaxing 0 0 0 1  Restless 0 0 0 0  Easily annoyed or irritable 0 0 0 0  Afraid - awful might happen 0 0 0 0  Total GAD 7 Score 0 0 0 3  Anxiety Difficulty Not difficult at all Not difficult at all Not difficult at all        08/16/2022    1:47 PM 05/17/2022   11:10 AM 12/10/2021    3:00 PM  Depression screen PHQ 2/9  Decreased Interest 0 0 0  Down, Depressed, Hopeless 0 0 0  PHQ - 2 Score 0 0 0  Altered sleeping 2 0 0  Tired, decreased energy 1 0 0  Change in appetite 0 0 0  Feeling bad or failure about yourself  0 0 0  Trouble concentrating 0 0 0  Moving slowly or fidgety/restless 0 0 0  Suicidal thoughts 0 0 0  PHQ-9 Score 3 0 0  Difficult doing work/chores Not difficult at all Not difficult at all Not difficult at all    BP Readings from Last 3 Encounters:  08/16/22 104/76  05/17/22 120/62  12/10/21 128/70    Physical Exam Vitals and nursing note reviewed.  Constitutional:      General: She is not in acute distress.    Appearance: Normal appearance. She is well-developed.  HENT:     Head: Normocephalic and atraumatic.  Cardiovascular:     Rate and Rhythm: Normal rate and regular rhythm.   Pulmonary:     Effort: Pulmonary effort is normal. No respiratory distress.     Breath sounds: No wheezing or rhonchi.  Musculoskeletal:     Cervical back: Normal range of motion.     Right lower leg: No edema.     Left lower leg: No edema.  Skin:    General: Skin is warm and dry.     Findings: No rash.  Neurological:     General: No focal deficit present.     Mental Status: She is alert and oriented to person, place, and time.  Psychiatric:        Mood and Affect: Mood normal.        Behavior: Behavior normal.     Wt Readings from Last 3 Encounters:  08/16/22 177 lb (80.3 kg)  05/17/22 171 lb (77.6 kg)  12/10/21 163 lb 3.2 oz (74 kg)    BP 104/76   Ht _0  (1.575 m)   Wt 177 lb (80.3 kg)   BMI 32.37 kg/m   Assessment and Plan: 1. Moderate asthma with exacerbation, unspecified whether persistent With likely component of GERD Will treat with Zpak, steroid taper, Omeprazole Educated on not eating close to bedtime and elevating the HOB Follow up in one month. Wexela increased to 500 mg. - azithromycin (ZITHROMAX Z-PAK) 250 MG tablet; UAD  Dispense: 6 each; Refill: 0 - predniSONE (DELTASONE) 10 MG tablet; Take 6 tablets (60 mg total) by mouth daily with breakfast for 1 day, THEN 5 tablets (50 mg total) daily with breakfast for 1 day, THEN 4 tablets (40 mg total) daily with breakfast for 1 day, THEN 3 tablets (30 mg total) daily with breakfast for 1 day, THEN 2 tablets (20 mg total) daily with breakfast for 1 day, THEN 1 tablet (10 mg total) daily with breakfast for 1 day.  Dispense: 21 tablet; Refill: 0 - omeprazole (PRILOSEC) 40 MG capsule; Take 1 capsule (40 mg total) by mouth daily.  Dispense: 30 capsule; Refill: 1  2. Mild persistent asthma without complication - fluticasone-salmeterol (WIXELA INHUB) 500-50 MCG/ACT AEPB; Inhale 1 puff into the lungs in the morning and at bedtime.  Dispense: 60 each; Refill: 5   Partially dictated using Editor, commissioning. Any errors are  unintentional.  Halina Maidens, MD [facility]  08/16/2022

## 2022-08-24 ENCOUNTER — Ambulatory Visit
Admission: EM | Admit: 2022-08-24 | Discharge: 2022-08-24 | Disposition: A | Discharge status: Home / Self Care | Attending: Physician Assistant | Admitting: Physician Assistant

## 2022-08-24 ENCOUNTER — Ambulatory Visit (INDEPENDENT_AMBULATORY_CARE_PROVIDER_SITE_OTHER)

## 2022-08-24 DIAGNOSIS — R509 Fever, unspecified: Secondary | ICD-10-CM

## 2022-08-24 DIAGNOSIS — Z1152 Encounter for screening for COVID-19: Secondary | ICD-10-CM | POA: Insufficient documentation

## 2022-08-24 DIAGNOSIS — R059 Cough, unspecified: Secondary | ICD-10-CM | POA: Diagnosis present

## 2022-08-24 DIAGNOSIS — J45901 Unspecified asthma with (acute) exacerbation: Secondary | ICD-10-CM | POA: Diagnosis not present

## 2022-08-24 DIAGNOSIS — R051 Acute cough: Secondary | ICD-10-CM

## 2022-08-24 DIAGNOSIS — R0602 Shortness of breath: Secondary | ICD-10-CM | POA: Diagnosis not present

## 2022-08-24 DIAGNOSIS — J189 Pneumonia, unspecified organism: Secondary | ICD-10-CM

## 2022-08-24 DIAGNOSIS — F17210 Nicotine dependence, cigarettes, uncomplicated: Secondary | ICD-10-CM | POA: Diagnosis not present

## 2022-08-24 LAB — SARS CORONAVIRUS 2 BY RT PCR: SARS Coronavirus 2 by RT PCR: NEGATIVE

## 2022-08-24 LAB — RAPID INFLUENZA A&B ANTIGENS
Influenza A (ARMC): NEGATIVE
Influenza B (ARMC): NEGATIVE

## 2022-08-24 MED ORDER — AZITHROMYCIN 250 MG PO TABS: 250.0000 mg | ORAL_TABLET | Freq: Every day | ORAL | 0 refills | Status: DC

## 2022-08-24 MED ORDER — ALBUTEROL SULFATE (2.5 MG/3ML) 0.083% IN NEBU: 2.5000 mg | INHALATION_SOLUTION | Freq: Four times a day (QID) | RESPIRATORY_TRACT | 1 refills | Status: DC | PRN

## 2022-08-24 MED ORDER — PROMETHAZINE-DM 6.25-15 MG/5ML PO SYRP: 5.0000 mL | ORAL_SOLUTION | Freq: Four times a day (QID) | ORAL | 0 refills | Status: DC | PRN

## 2022-08-24 MED ORDER — IPRATROPIUM-ALBUTEROL 0.5-2.5 (3) MG/3ML IN SOLN
3.0000 mL | Freq: Once | RESPIRATORY_TRACT | Status: AC
  Administered 2022-08-24: 3 mL via RESPIRATORY_TRACT

## 2022-08-24 MED ORDER — AMOXICILLIN-POT CLAVULANATE 875-125 MG PO TABS: 1.0000 | ORAL_TABLET | Freq: Two times a day (BID) | ORAL | 0 refills | Status: AC

## 2022-08-24 MED ORDER — PREDNISONE 50 MG PO TABS: 50.0000 mg | ORAL_TABLET | Freq: Every day | ORAL | 0 refills | Status: AC

## 2022-08-24 NOTE — ED Triage Notes (Signed)
Pt c/o asthma attack x2days congestion x2days  Pt was working in [employer] and was exposed to the grease in [place] and has had an asthma attack.  Pt states that her inhaler has not been helping.   Pt states that she had a URI 1 week ago

## 2022-08-24 NOTE — ED Provider Notes (Signed)
[facility]    CSN: [id number] Arrival date & time: 08/24/22  0907      History   Chief Complaint Chief Complaint  Patient presents with   Asthma    HPI Tamara George is a 61 y.o. female presenting for onset of fever, fatigue, body aches, cough, congestion, wheezing and shortness of breath yesterday.  Symptoms have worsened today.  She does have a history of asthma.  Reports she had an asthma exacerbation on 08/16/2022 and was given corticosteroids.  She says she was sick with a cough and congestion at that time.  Symptoms improved for couple days and then her symptoms returned.  She has used inhaler at home.  Has not taken anything for the fever.  Temp is currently 100.6 degrees.  She does not have any sick contacts.  No known flu or COVID exposure.  She is not reporting any other symptoms and no other complaints or concerns.  HPI  Past Medical History:  Diagnosis Date   Asthma     Patient Active Problem List   Diagnosis Date Noted   S/P total hysterectomy 12/10/2021   Mild hyperlipidemia 05/17/2021   History of total hip arthroplasty 08/22/2020   Tobacco use disorder 11/12/2019   Arthropathy of both temporomandibular joints 11/12/2019   Corn of foot 11/12/2019   Generalized anxiety disorder 11/12/2019   Mild persistent asthma without complication 11/11/2019   Localized, primary osteoarthritis of shoulder region 05/17/2019    Past Surgical History:  Procedure Laterality Date   ABDOMINAL HYSTERECTOMY  12/209   partial- still has cervix and need paps - endometriosis   CARPAL TUNNEL RELEASE Right    CATARACT EXTRACTION, BILATERAL Bilateral    FRACTURE SURGERY Right    tib-fib   HERNIA REPAIR Left    Inguinal   REPEAT CESAREAN SECTION     TOTAL KNEE ARTHROPLASTY Right 2013    OB History   No obstetric history on file.      Home Medications    Prior to Admission medications   Medication Sig Start Date End Date Taking? Authorizing Provider   acetaminophen (TYLENOL) 650 MG CR tablet Take 650 mg by mouth every 8 (eight) hours as needed for pain.   Yes [provider]  albuterol (PROVENTIL) (2.5 MG/3ML) 0.083% nebulizer solution Take 3 mLs (2.5 mg total) by nebulization every 6 (six) hours as needed for wheezing or shortness of breath. 08/24/22  Yes Shirlee Latch, PA-C  albuterol (VENTOLIN HFA) 108 (90 Base) MCG/ACT inhaler Inhale 2 puffs into the lungs every 6 (six) hours as needed for wheezing or shortness of breath. 07/11/22  Yes Reubin Milan, MD  amoxicillin-clavulanate (AUGMENTIN) 875-125 MG tablet Take 1 tablet by mouth every 12 (twelve) hours for 7 days. 08/24/22 08/31/22 Yes Shirlee Latch, PA-C  azithromycin (ZITHROMAX) 250 MG tablet Take 1 tablet (250 mg total) by mouth daily. Take first 2 tablets together, then 1 every day until finished. 08/24/22  Yes Eusebio Friendly B, PA-C  fluticasone-salmeterol (WIXELA INHUB) 500-50 MCG/ACT AEPB Inhale 1 puff into the lungs in the morning and at bedtime. 08/16/22  Yes Reubin Milan, MD  omeprazole (PRILOSEC) 40 MG capsule Take 1 capsule (40 mg total) by mouth daily. 08/16/22  Yes Reubin Milan, MD  predniSONE (DELTASONE) 50 MG tablet Take 1 tablet (50 mg total) by mouth daily for 5 days. 08/24/22 08/29/22 Yes Shirlee Latch, PA-C  promethazine-dextromethorphan (PROMETHAZINE-DM) 6.25-15 MG/5ML syrup Take 5 mLs by mouth 4 (four) times  daily as needed. 08/24/22  Yes Shirlee LatchEaves, Shawnique Mariotti B, PA-C    Family History Family History  Problem Relation Age of Onset   Hypertension Other    Hypertension Maternal Aunt    Diabetes Maternal Aunt    Diabetes Maternal Uncle    Lung cancer Father    Diabetes Maternal Grandfather     Social History Social History   Tobacco Use   Smoking status: Some Days    Packs/day: 0.15    Years: 10.00    Total pack years: 1.50    Types: Cigarettes   Smokeless tobacco: Never   Tobacco comments:    3 cigs daily- trying to quit - 2023   Vaping Use   Vaping Use: Never used  Substance Use Topics   Alcohol use: Never   Drug use: Never     Allergies   Tramadol, Aspirin, and Naproxen   Review of Systems Review of Systems  Constitutional:  Positive for fatigue and fever. Negative for chills and diaphoresis.  HENT:  Positive for congestion and rhinorrhea. Negative for ear pain, sinus pressure, sinus pain and sore throat.   Respiratory:  Positive for cough, shortness of breath and wheezing.   Cardiovascular:  Negative for chest pain.  Gastrointestinal:  Negative for abdominal pain, nausea and vomiting.  Musculoskeletal:  Positive for myalgias. Negative for arthralgias.  Skin:  Negative for rash.  Neurological:  Positive for headaches. Negative for weakness.  Hematological:  Negative for adenopathy.     Physical Exam Triage Vital Signs ED Triage Vitals  Enc Vitals Group     BP 08/24/22 1044 105/73     Pulse Rate 08/24/22 1044 67     Resp 08/24/22 1044 18     Temp 08/24/22 1044 (!) 100.6 F (38.1 C)     Temp Source 08/24/22 1044 Oral     SpO2 08/24/22 1044 (!) 89 %     Weight 08/24/22 1043 174 lb (78.9 kg)     Height 08/24/22 1043 5\' 2"  (1.575 m)     Head Circumference --      Peak Flow --      Pain Score 08/24/22 1043 7     Pain Loc --      Pain Edu? --      Excl. in GC? --    No data found.  Updated Vital Signs BP 105/73 (BP Location: Left Arm)   Pulse 70   Temp (!) 100.6 F (38.1 C) (Oral)   Resp 18   Ht 5\' 2"  (1.575 m)   Wt 174 lb (78.9 kg)   SpO2 95% Comment: left 1st Toe  BMI 31.83 kg/m    Physical Exam Vitals and nursing note reviewed.  Constitutional:      General: She is not in acute distress.    Appearance: Normal appearance. She is ill-appearing. She is not toxic-appearing.  HENT:     Head: Normocephalic and atraumatic.     Nose: Congestion present.     Mouth/Throat:     Mouth: Mucous membranes are moist.     Pharynx: Oropharynx is clear.  Eyes:     General: No scleral  icterus.       Right eye: No discharge.        Left eye: No discharge.     Conjunctiva/sclera: Conjunctivae normal.  Cardiovascular:     Rate and Rhythm: Normal rate and regular rhythm.     Heart sounds: Normal heart sounds.  Pulmonary:     Effort: Respiratory  distress present.     Breath sounds: Wheezing and rhonchi present.  Musculoskeletal:     Cervical back: Neck supple.  Skin:    General: Skin is dry.  Neurological:     General: No focal deficit present.     Mental Status: She is alert. Mental status is at baseline.     Motor: No weakness.     Gait: Gait normal.  Psychiatric:        Mood and Affect: Mood normal.        Behavior: Behavior normal.        Thought Content: Thought content normal.      UC Treatments / Results  Labs (all labs ordered are listed, but only abnormal results are displayed) Labs Reviewed  RAPID INFLUENZA A&B ANTIGENS  SARS CORONAVIRUS 2 BY RT PCR    EKG   Radiology DG Chest 2 View  Result Date: 08/24/2022 CLINICAL DATA:  Fever and cough. EXAM: CHEST - 2 VIEW COMPARISON:  None Available. FINDINGS: Heart size is normal. No pleural effusion or edema. Patchy peripheral airspace density within the lateral right upper lobe is identified. Left mid lung platelike atelectasis versus scar identified. No pleural effusion or edema. Mild thoracic scoliosis. IMPRESSION: Patchy peripheral airspace density within the lateral right upper lobe compatible with pneumonia. Electronically Signed   By: Kerby Moors M.D.   On: 08/24/2022 11:31    Procedures Procedures (including critical care time)  Medications Ordered in UC Medications  ipratropium-albuterol (DUONEB) 0.5-2.5 (3) MG/3ML nebulizer solution 3 mL (3 mLs Nebulization Given 08/24/22 1055)  ipratropium-albuterol (DUONEB) 0.5-2.5 (3) MG/3ML nebulizer solution 3 mL (3 mLs Nebulization Given 08/24/22 1114)    Initial Impression / Assessment and Plan / UC Course  I have reviewed the triage vital  signs and the nursing notes.  Pertinent labs & imaging results that were available during my care of the patient were reviewed by me and considered in my medical decision making (see chart for details).   61 year old female with history of asthma presents for fever, fatigue, cough, congestion, body aches and headaches that began yesterday.  Temp currently 100.6 degrees.  Oxygen initially 90%.  Patient given DuoNeb and increases to 92%.  Patient given second DuoNeb.  On examination she is ill-appearing and mildly in respiratory distress.  She pulses to inhale at times and speaking.  Of note, she does have fake nails so I am unsure if the oxygen reading is accurate or not.  Symptoms do improve with the DuoNeb's.  She has diffuse wheezing and rhonchi throughout all lung fields.  Also nasal congestion present.  O2 recheck on patient's great toe is 95%.  The fake nails are probably causing a lower oxygen reading then what is actually true.  She is breathing better after the DuoNeb treatments.  Ordered rapid flu, PCR COVID and chest x-ray.  Flu and COVID are negative.  Chest x-ray shows patchy airspace density in the lateral right upper lobe compatible with pneumonia.  Discussed result with patient.  Will treat her at this time with Augmentin, azithromycin, prednisone and Promethazine DM.  Will have her perform nebulizer treatments at home as needed.  Sent the albuterol solution to the pharmacy.  Plenty rest and fluids.  Advised following up with PCP for repeat x-ray in the next few weeks.  ED precautions discussed.  Note given for work.   Final Clinical Impressions(s) / UC Diagnoses   Final diagnoses:  Pneumonia of right upper lobe due to infectious organism  Fever, unspecified  Exacerbation of asthma, unspecified asthma severity, unspecified whether persistent  Acute cough  Shortness of breath     Discharge Instructions      -You have pneumonia.  I have sent antibiotics to the pharmacy.   Take full course of each medication at the same time.  I also sent prednisone for a few days, albuterol nebulizer solution and cough medication.  Increase your rest and fluid intake.  Tylenol for fever as needed. - Follow-up with your PCP.  You will need a repeat x-ray in the next 3 to 4 weeks to make sure things are resolving. - If you develop a higher fever or start to have increased weakness or shortness of breath then you will need to go to the emergency department.  You should start to feel better over the next couple days.     ED Prescriptions     Medication Sig Dispense Auth. Provider   amoxicillin-clavulanate (AUGMENTIN) 875-125 MG tablet Take 1 tablet by mouth every 12 (twelve) hours for 7 days. 14 tablet Laurene Footman B, PA-C   azithromycin (ZITHROMAX) 250 MG tablet Take 1 tablet (250 mg total) by mouth daily. Take first 2 tablets together, then 1 every day until finished. 6 tablet Laurene Footman B, PA-C   predniSONE (DELTASONE) 50 MG tablet Take 1 tablet (50 mg total) by mouth daily for 5 days. 5 tablet Laurene Footman B, PA-C   albuterol (PROVENTIL) (2.5 MG/3ML) 0.083% nebulizer solution Take 3 mLs (2.5 mg total) by nebulization every 6 (six) hours as needed for wheezing or shortness of breath. 75 mL Laurene Footman B, PA-C   promethazine-dextromethorphan (PROMETHAZINE-DM) 6.25-15 MG/5ML syrup Take 5 mLs by mouth 4 (four) times daily as needed. 118 mL Danton Clap, PA-C      PDMP not reviewed this encounter.   Danton Clap, PA-C 08/24/22 1240

## 2022-08-24 NOTE — Discharge Instructions (Addendum)
-  You have pneumonia.  I have sent antibiotics to the pharmacy.  Take full course of each medication at the same time.  I also sent prednisone for a few days, albuterol nebulizer solution and cough medication.  Increase your rest and fluid intake.  Tylenol for fever as needed. - Follow-up with your PCP.  You will need a repeat x-ray in the next 3 to 4 weeks to make sure things are resolving. - If you develop a higher fever or start to have increased weakness or shortness of breath then you will need to go to the emergency department.  You should start to feel better over the next couple days.

## 2022-09-04 ENCOUNTER — Other Ambulatory Visit: Payer: Self-pay

## 2022-09-04 ENCOUNTER — Inpatient Hospital Stay: Admission: RE | Admit: 2022-09-04 | Payer: Self-pay | Source: Ambulatory Visit

## 2022-09-06 ENCOUNTER — Telehealth: Payer: Self-pay

## 2022-09-06 NOTE — Telephone Encounter (Signed)
Received a form for disability and leave. Wanted to know if it was for her having pneumonia on 08/24/2022? If so Dr. Army Melia can fill out the form for 10 days.  KP

## 2022-09-09 NOTE — Telephone Encounter (Signed)
Spoke to pt she stated he form was for her having pneumonia.  KP

## 2022-09-17 ENCOUNTER — Ambulatory Visit: Admitting: Internal Medicine

## 2022-09-18 ENCOUNTER — Encounter: Payer: Self-pay | Admitting: Family Medicine

## 2022-09-18 ENCOUNTER — Ambulatory Visit (INDEPENDENT_AMBULATORY_CARE_PROVIDER_SITE_OTHER): Payer: BC Managed Care – PPO | Admitting: Family Medicine

## 2022-09-18 VITALS — BP 130/84 | HR 80 | Temp 98.7°F | Ht 62.0 in | Wt 174.0 lb

## 2022-09-18 DIAGNOSIS — R051 Acute cough: Secondary | ICD-10-CM

## 2022-09-18 DIAGNOSIS — J0100 Acute maxillary sinusitis, unspecified: Secondary | ICD-10-CM | POA: Diagnosis not present

## 2022-09-18 LAB — POCT INFLUENZA A/B
Influenza A, POC: NEGATIVE
Influenza B, POC: NEGATIVE

## 2022-09-18 LAB — POC COVID19 BINAXNOW: SARS Coronavirus 2 Ag: NEGATIVE

## 2022-09-18 MED ORDER — AZITHROMYCIN 250 MG PO TABS: ORAL_TABLET | ORAL | 0 refills | Status: AC

## 2022-09-18 MED ORDER — PROMETHAZINE-DM 6.25-15 MG/5ML PO SYRP: 5.0000 mL | ORAL_SOLUTION | Freq: Four times a day (QID) | ORAL | 0 refills | Status: DC | PRN

## 2022-09-18 NOTE — Patient Instructions (Signed)
-  Take antibiotics for full course - Can use cough syrup as needed - Start Flonase, 2 sprays in each nostril daily while on antibiotics - Remain out of work until Monday - Follow-up as needed

## 2022-09-18 NOTE — Progress Notes (Signed)
     Primary Care / Sports Medicine Office Visit  Patient Information:  Patient ID: Tamara George, female DOB: Oct 25, 1960 Age: 62 y.o. MRN: [id number]   Tamara George is a pleasant 62 y.o. female presenting with the following:  Chief Complaint  Patient presents with   Ear Pain    Yesterday, cough, sore throat,     Vitals:   09/18/22 1018  BP: 130/84  Pulse: 80  Temp: 98.7 F (37.1 C)  SpO2: 97%   Vitals:   09/18/22 1018  Weight: 174 lb (78.9 kg)  Height: 5\' 2"  (1.575 m)   Body mass index is 31.83 kg/m.     Independent interpretation of notes and tests performed by another provider:   None  Procedures performed:   None  Pertinent History, Exam, Impression, and Recommendations:   Tamara George was seen today for ear pain.  Acute maxillary sinusitis, recurrence not specified Assessment & Plan: Few week history of progressive symptoms with significant worsening yesterday with progressive cough, sore throat, congestion.  Denies any shortness of air.  Patient is concerned given recent pneumonia.  She denies any fevers or chills, has been using OTC medications without significant response.  Examination with tenderness about the maxillary sinuses symmetrically, tympanic membranes and canals benign, nasopharynx with erythema and swelling, oropharynx with mild erythema, no exudate, lung fields are clear to auscultation bilaterally without wheezes, rales, rhonchi.  Clinical history and findings raise concern for acute maxillary sinusitis, azithromycin prescribed as well as as needed promethazine-DM, supportive care advised.  She can follow-up as needed. (ED visit note from 08/24/2022 reviewed)  Orders: -     Azithromycin; Take 2 tablets on day 1, then 1 tablet daily on days 2 through 5  Dispense: 6 tablet; Refill: 0 -     Promethazine-DM; Take 5 mLs by mouth 4 (four) times daily as needed for cough.  Dispense: 118 mL; Refill: 0  Acute cough -     POC COVID-19 BinaxNow -      POCT Influenza A/B     Orders & Medications Meds ordered this encounter  Medications   azithromycin (ZITHROMAX) 250 MG tablet    Sig: Take 2 tablets on day 1, then 1 tablet daily on days 2 through 5    Dispense:  6 tablet    Refill:  0   promethazine-dextromethorphan (PROMETHAZINE-DM) 6.25-15 MG/5ML syrup    Sig: Take 5 mLs by mouth 4 (four) times daily as needed for cough.    Dispense:  118 mL    Refill:  0   Orders Placed This Encounter  Procedures   POC COVID-19   POCT Influenza A/B     No follow-ups on file.     Montel Culver, MD, [facility]   Primary Care Sports Medicine Primary Care and Sports Medicine at [facility]

## 2022-09-18 NOTE — Assessment & Plan Note (Addendum)
Few week history of progressive symptoms with significant worsening yesterday with progressive cough, sore throat, congestion.  Denies any shortness of air.  Patient is concerned given recent pneumonia.  She denies any fevers or chills, has been using OTC medications without significant response.  Examination with tenderness about the maxillary sinuses symmetrically, tympanic membranes and canals benign, nasopharynx with erythema and swelling, oropharynx with mild erythema, no exudate, lung fields are clear to auscultation bilaterally without wheezes, rales, rhonchi.  Clinical history and findings raise concern for acute maxillary sinusitis, azithromycin prescribed as well as as needed promethazine-DM, supportive care advised.  She can follow-up as needed. (ED visit note from 08/24/2022 reviewed)

## 2022-10-02 ENCOUNTER — Ambulatory Visit: Payer: Self-pay

## 2022-10-02 NOTE — Telephone Encounter (Signed)
Noted pt is going to UC.  KP

## 2022-10-02 NOTE — Telephone Encounter (Signed)
  Chief Complaint: cough Symptoms: cough and congestion ongoing with wheezing and SOB at times with activity, chills, HA Frequency: ongoing since 08/2022 Pertinent Negatives: Patient denies fever Disposition: [] ED /[x] Urgent Care (no appt availability in office) / [] Appointment(In office/virtual)/ []  [facility]/ [] Home Care/ [] Refused Recommended Disposition /[] [facility]/ []  Follow-up with PCP Additional Notes: pt has completed abx and cough syrup and using her inhalers as prescribed and still having sx. No appts until 10/04/22 with Dr. Ronnald Ramp. Offered UC today d/t no wait time but pt can't go today, scheduled UC appt for tomorrow at 1000.   Reason for Disposition  [1] MILD difficulty breathing (e.g., minimal/no SOB at rest, SOB with walking, pulse <100) AND [2] still present when not coughing  Answer Assessment - Initial Assessment Questions 1. ONSET: "When did the cough begin?"      Since had PNA  2. SEVERITY: "How bad is the cough today?"      Not getting any better 3. SPUTUM: "Describe the color of your sputum" (none, dry cough; clear, white, yellow, green)     Clear and thick  5. DIFFICULTY BREATHING: "Are you having difficulty breathing?" If Yes, ask: "How bad is it?" (e.g., mild, moderate, severe)    - MILD: No SOB at rest, mild SOB with walking, speaks normally in sentences, can lie down, no retractions, pulse < 100.    - MODERATE: SOB at rest, SOB with minimal exertion and prefers to sit, cannot lie down flat, speaks in phrases, mild retractions, audible wheezing, pulse 100-120.    - SEVERE: Very SOB at rest, speaks in single words, struggling to breathe, sitting hunched forward, retractions, pulse > 120      Mild at times  6. FEVER: "Do you have a fever?" If Yes, ask: "What is your temperature, how was it measured, and when did it start?"     No 10. OTHER SYMPTOMS: "Do you have any other symptoms?" (e.g., runny nose, wheezing, chest pain)       Wheezing and  chills  Protocols used: Cough - Acute Productive-A-AH

## 2022-10-03 ENCOUNTER — Ambulatory Visit (INDEPENDENT_AMBULATORY_CARE_PROVIDER_SITE_OTHER)

## 2022-10-03 ENCOUNTER — Ambulatory Visit
Admission: RE | Admit: 2022-10-03 | Discharge: 2022-10-03 | Disposition: A | Discharge status: Home / Self Care | Source: Ambulatory Visit | Attending: Emergency Medicine | Admitting: Emergency Medicine

## 2022-10-03 VITALS — BP 118/73 | HR 78 | Temp 98.4°F | Ht 62.0 in | Wt 174.0 lb

## 2022-10-03 DIAGNOSIS — J189 Pneumonia, unspecified organism: Secondary | ICD-10-CM

## 2022-10-03 DIAGNOSIS — J0100 Acute maxillary sinusitis, unspecified: Secondary | ICD-10-CM

## 2022-10-03 MED ORDER — LEVOFLOXACIN 500 MG PO TABS: 500.0000 mg | ORAL_TABLET | Freq: Every day | ORAL | 0 refills | Status: DC

## 2022-10-03 MED ORDER — BENZONATATE 100 MG PO CAPS: 200.0000 mg | ORAL_CAPSULE | Freq: Three times a day (TID) | ORAL | 0 refills | Status: DC

## 2022-10-03 MED ORDER — PREDNISONE 20 MG PO TABS: 60.0000 mg | ORAL_TABLET | Freq: Every day | ORAL | 0 refills | Status: AC

## 2022-10-03 MED ORDER — PROMETHAZINE-DM 6.25-15 MG/5ML PO SYRP: 5.0000 mL | ORAL_SOLUTION | Freq: Four times a day (QID) | ORAL | 0 refills | Status: DC | PRN

## 2022-10-03 NOTE — Discharge Instructions (Addendum)
Your chest x-ray shows a new opacity in your right middle lobe which could represent a new pneumonia.  I am going to treat you for commune acquired pneumonia and start you on Levaquin 500 mg once daily for 7 days.  I would you start taking the prednisone, 60 mg each day at breakfast, today to help decrease pulmonary inflammation.  Continue to use your albuterol every 4-6 hours as needed for shortness of breath or wheezing.  Take the [facility] every 8 hours during the day as needed for cough.  Take them with a small sip of water.  They may give you some numbness to the base of your tongue or metallic taste in her mouth, this is normal.  Please continue to use the Promethazine DM cough syrup at bedtime as needed for cough.  You need to make a follow-up appointment with your primary care provider for next week for reevaluation.

## 2022-10-03 NOTE — ED Triage Notes (Signed)
Pt states she was seen in December for pneumonia, still coughing, wheezing. Pt states she never felt better

## 2022-10-03 NOTE — ED Provider Notes (Signed)
[facility]    CSN: [id number] Arrival date & time: 10/03/22  6063      History   Chief Complaint Chief Complaint  Patient presents with   Wheezing    Appt. cough , congestion and wheezing ongoing since 08/2022// [employer]// [phone]// PEC NT - Entered by patient   Cough    HPI Tamara George is a 62 y.o. female.   HPI  62 year old female here for evaluation of ongoing respiratory complaints.  The patient was evaluated in this urgent care on 08/24/2022 and diagnosed with pneumonia in her right upper lobe.  Did with dual therapy of Augmentin 875 for 7 days with a Z-Pak.  She was also prescribed an albuterol inhaler and nebulizer solution at that time to help with wheezing.  She states that she is continue to use her inhaler but it is not helping.  Her wheezing increases when she lays down at night.  Her cough remains productive but her sputum is thick and white.  She also reports feeling fatigued and having dyspnea on exertion.  On 09/18/2022 she was seen by her PCP and diagnosed with acute maxillary sinusitis and was prescribed a second round of azithromycin.  Patient does have a history of mild persistent asthma and takes Wixela twice daily for asthma control.  She also has a history of reflux disease and is on Prilosec.  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      Home Medications    Prior to Admission medications   Medication Sig Start Date End Date Taking? Authorizing Provider  albuterol (PROVENTIL) (2.5 MG/3ML) 0.083% nebulizer solution Take 3 mLs (2.5 mg total) by nebulization every 6 (six) hours as needed for wheezing or shortness of breath. 08/24/22  Yes Shirlee Latch, PA-C  albuterol (VENTOLIN HFA) 108 (90 Base) MCG/ACT inhaler Inhale 2 puffs into the lungs every 6 (six) hours as needed for wheezing or shortness of breath. 07/11/22  Yes Reubin Milan, MD  benzonatate (TESSALON) 100 MG capsule Take 2 capsules (200 mg total) by mouth every 8 (eight) hours. 10/03/22  Yes Becky Augusta, NP  fluticasone-salmeterol [facility] INHUB) 500-50 MCG/ACT AEPB Inhale 1 puff into the lungs in the morning and at bedtime. 08/16/22  Yes Reubin Milan, MD  levofloxacin (LEVAQUIN) 500 MG tablet Take 1 tablet (500 mg total) by mouth daily. 10/03/22  Yes Becky Augusta, NP  omeprazole (PRILOSEC) 40 MG capsule Take 1 capsule (40 mg total) by mouth daily. 08/16/22  Yes Reubin Milan, MD  predniSONE (DELTASONE) 20 MG tablet Take 3 tablets (60 mg total) by mouth daily with breakfast for 5 days. 3 tablets daily for 5 days. 10/03/22 10/08/22 Yes Margarette Canada, NP  acetaminophen (TYLENOL) 650 MG CR tablet Take 650 mg by mouth every 8 (eight) hours as needed for pain.    [provider]  promethazine-dextromethorphan (PROMETHAZINE-DM) 6.25-15 MG/5ML syrup Take 5 mLs by mouth 4 (four) times daily as needed for cough. 10/03/22   Margarette Canada, NP    Family History Family History  Problem Relation Age of Onset   Hypertension Other    Hypertension Maternal Aunt     Diabetes Maternal Aunt    Diabetes Maternal Uncle    Lung cancer Father    Diabetes Maternal Grandfather     Social History Social History   Tobacco Use   Smoking status: Some Days    Packs/day: 0.15    Years: 10.00    Total pack years: 1.50    Types: Cigarettes   Smokeless tobacco: Never   Tobacco comments:    3 cigs daily- trying to quit - 2023  Vaping Use   Vaping Use: Never used  Substance Use Topics   Alcohol use: Never   Drug use: Never     Allergies   Tramadol, Aspirin, and Naproxen   Review of Systems Review of Systems  Constitutional:  Positive for fatigue. Negative for fever.  Respiratory:  Positive for cough, shortness of breath and wheezing.   Cardiovascular:  Negative for chest pain.     Physical Exam Triage Vital Signs ED Triage Vitals  Enc Vitals Group     BP 10/03/22 1021 118/73     Pulse Rate 10/03/22 1021 78     Resp --      Temp 10/03/22 1021 98.4 F (36.9 C)     Temp Source 10/03/22 1021 Oral     SpO2 10/03/22 1021 97 %     Weight 10/03/22 1012 174 lb (78.9 kg)     Height 10/03/22 1012 5\' 2"  (1.575 m)     Head Circumference --      Peak Flow --      Pain Score --      Pain Loc --      Pain Edu? --      Excl. in [place]? --    No data found.  Updated Vital Signs BP 118/73 (BP Location: Left Arm)   Pulse 78   Temp 98.4 F (36.9 C) (Oral)   Ht 5\' 2"  (1.575 m)   Wt 174 lb (78.9 kg)   SpO2 97%   BMI 31.83 kg/m   Visual Acuity Right Eye Distance:   Left Eye Distance:   Bilateral Distance:    Right Eye Near:   Left Eye Near:    Bilateral Near:     Physical Exam Vitals and nursing note reviewed.  Constitutional:      Appearance: Normal appearance. She is not ill-appearing.  HENT:     Head: Normocephalic and atraumatic.  Cardiovascular:     Rate and Rhythm: Normal rate and regular rhythm.     Pulses: Normal pulses.     Heart sounds: Normal heart sounds. No murmur heard.    No friction rub. No gallop.  Pulmonary:      Effort: Pulmonary effort is normal.     Breath sounds: Wheezing and rhonchi present. No rales.  Skin:    General: Skin is warm and dry.     Capillary Refill: Capillary  refill takes less than 2 seconds.     Findings: No erythema or rash.  Neurological:     General: No focal deficit present.     Mental Status: She is alert and oriented to person, place, and time.      UC Treatments / Results  Labs (all labs ordered are listed, but only abnormal results are displayed) Labs Reviewed - No data to display  EKG   Radiology DG Chest 2 View  Result Date: 10/03/2022 CLINICAL DATA:  Screening December for pneumonia, still coughing and wheezing. EXAM: CHEST - 2 VIEW COMPARISON:  Chest radiograph 08/24/2022 FINDINGS: The cardiomediastinal silhouette is stable and within normal limits. Linear opacities in the left midlung are slightly increased from the prior study. There are new predominantly linear opacities in the right middle lobe but with more ill-defined hazy opacities more laterally. Previously seen opacities projecting over the right upper lobe appear resolved. There is no other new or worsening focal airspace disease. There is no pulmonary edema. There is no pleural effusion or pneumothorax There is no acute osseous abnormality. IMPRESSION: New opacities in the right middle lobe may reflect platelike atelectasis or pneumonia. Previously seen opacities in the right upper lobe have resolved. Electronically Signed   By: Valetta Mole M.D.   On: 10/03/2022 11:00    Procedures Procedures (including critical care time)  Medications Ordered in UC Medications - No data to display  Initial Impression / Assessment and Plan / UC Course  I have reviewed the triage vital signs and the nursing notes.  Pertinent labs & imaging results that were available during my care of the patient were reviewed by me and considered in my medical decision making (see chart for details).   Patient is a pleasant,  nontoxic-appearing 62 year old female here for evaluation of ongoing cough, shortness breath, and wheezing since being diagnosed with pneumonia on 08/24/2022.  She has had 2 rounds of antibiotics in the interim.  First was dual therapy of Augmentin and azithromycin for the community-acquired pneumonia and the second was a second round of azithromycin for maxillary sinusitis.  She has been using her inhalers and she is prescribed an inhaled LABA twice daily.  She was not prescribed prednisone when she was in for her pneumonia.  She reports that she feels fatigued and has dyspnea on exertion.  She is continuing to wheeze and it is worse at night.  She has been taking a cough syrup and using her inhalers without improvement of symptoms.  She is able to speak in full sentences without dyspnea or tachypnea.  Her room air oxygen saturation here in clinic is 97%.  I will order repeat chest x-ray to evaluate for any acute cardiopulmonary process as well to evaluate for the resolution of the pneumonia in the right upper lobe seen on the visit 08/24/2022.  Chest x-ray independently reviewed and evaluated by me.  Impression the patchy infiltrate identified on the chest x-ray from 08/24/2022 appears to have resolved.  There is however a linear opacity in the right middle lobe near the base in the right lung that was not previously identified.  The linear atelectasis/scarring in the left lower lobe is still present.  Radiology overread is pending. Radiology impression states new opacities in the right middle lobe may reflect platelike atelectasis or pneumonia.  Previously seen opacities in the right upper lobe have resolved.  Given that patient has continued to have a cough with sputum production I am going to treat her again for  community-acquired pneumonia with Levaquin 500 mg once daily for 7 days.  I am also going to treat her with prednisone 60 mg daily x 5 days as a burst dose to help with pulmonary inflammation.  She  needs to follow-up next week with [place] for reevaluation.  If she develops any worsening shortness of breath she is to go to the ER for evaluation.   Final Clinical Impressions(s) / UC Diagnoses   Final diagnoses:  Community acquired pneumonia of right middle lobe of lung     Discharge Instructions      Your chest x-ray shows a new opacity in your right middle lobe which could represent a new pneumonia.  I am going to treat you for commune acquired pneumonia and start you on Levaquin 500 mg once daily for 7 days.  I would you start taking the prednisone, 60 mg each day at breakfast, today to help decrease pulmonary inflammation.  Continue to use your albuterol every 4-6 hours as needed for shortness of breath or wheezing.  Take the [facility] every 8 hours during the day as needed for cough.  Take them with a small sip of water.  They may give you some numbness to the base of your tongue or metallic taste in her mouth, this is normal.  Please continue to use the Promethazine DM cough syrup at bedtime as needed for cough.  You need to make a follow-up appointment with your primary care provider for next week for reevaluation.     ED Prescriptions     Medication Sig Dispense Auth. Provider   promethazine-dextromethorphan (PROMETHAZINE-DM) 6.25-15 MG/5ML syrup Take 5 mLs by mouth 4 (four) times daily as needed for cough. 118 mL Margarette Canada, NP   levofloxacin (LEVAQUIN) 500 MG tablet Take 1 tablet (500 mg total) by mouth daily. 7 tablet Margarette Canada, NP   predniSONE (DELTASONE) 20 MG tablet Take 3 tablets (60 mg total) by mouth daily with breakfast for 5 days. 3 tablets daily for 5 days. 15 tablet Margarette Canada, NP   benzonatate (TESSALON) 100 MG capsule Take 2 capsules (200 mg total) by mouth every 8 (eight) hours. 21 capsule Margarette Canada, NP      PDMP not reviewed this encounter.   Margarette Canada, NP 10/03/22 1108

## 2022-10-09 ENCOUNTER — Encounter: Payer: Self-pay | Admitting: Internal Medicine

## 2022-10-09 ENCOUNTER — Ambulatory Visit (INDEPENDENT_AMBULATORY_CARE_PROVIDER_SITE_OTHER): Payer: BC Managed Care – PPO | Admitting: Internal Medicine

## 2022-10-09 VITALS — BP 126/74 | HR 89 | Ht 62.0 in | Wt 185.6 lb

## 2022-10-09 DIAGNOSIS — J189 Pneumonia, unspecified organism: Secondary | ICD-10-CM

## 2022-10-09 DIAGNOSIS — J4530 Mild persistent asthma, uncomplicated: Secondary | ICD-10-CM

## 2022-10-09 NOTE — Assessment & Plan Note (Addendum)
Recurrent infections - pneumonia and sinusitis Continue Wixela and albuterol Will give Rx for nebulizer; refer to Pulmonary FMLA completed

## 2022-10-09 NOTE — Progress Notes (Signed)
Date:  10/09/2022   Name:  Tamara George   DOB:  1960/11/01   MRN:  [id number]   Chief Complaint: Pneumonia  HPI CAP - patient was seen in ED 08/24/22 with RUL infiltrate.    She was treated with Augmentin and Zpak with steroid pulse. Sinusitis - was seen in the office 09/18/22 and treated with another Zpak. CAP - seen in ED 10/03/22 with infiltrate of RML.  Treated with Levofloxacin and prednisone.  CXR 10/03/22: IMPRESSION: New opacities in the right middle lobe may reflect platelike atelectasis or pneumonia. Previously seen opacities in the right upper lobe have resolved.  Lab Results  Component Value Date   NA 144 07/30/2022   K 4.4 07/30/2022   CO2 21 07/30/2022   GLUCOSE 89 07/30/2022   BUN 12 07/30/2022   CREATININE 0.69 07/30/2022   CALCIUM 9.4 07/30/2022   EGFR 99 07/30/2022   GFRNONAA 93 11/12/2019   Lab Results  Component Value Date   CHOL 236 (H) 07/30/2022   HDL 70 07/30/2022   LDLCALC 155 (H) 07/30/2022   TRIG 65 07/30/2022   CHOLHDL 3.4 07/30/2022   Lab Results  Component Value Date   TSH 0.771 07/30/2022   Lab Results  Component Value Date   HGBA1C 5.9 (H) 07/30/2022   Lab Results  Component Value Date   WBC 6.6 07/30/2022   HGB 12.9 07/30/2022   HCT 38.6 07/30/2022   MCV 89 07/30/2022   PLT 323 07/30/2022   Lab Results  Component Value Date   ALT 19 07/30/2022   AST 15 07/30/2022   ALKPHOS 103 07/30/2022   BILITOT 0.3 07/30/2022   Lab Results  Component Value Date   VD25OH 20.3 02/15/2016     Review of Systems  Constitutional:  Positive for fatigue. Negative for appetite change, chills, diaphoresis and fever.  Eyes:  Negative for visual disturbance.  Respiratory:  Positive for cough, chest tightness, shortness of breath and wheezing.   Cardiovascular:  Negative for chest pain, palpitations and leg swelling.  Gastrointestinal:  Negative for abdominal pain.  Neurological:  Negative for dizziness, light-headedness and headaches.   Psychiatric/Behavioral:  Negative for dysphoric mood and sleep disturbance. The patient is not nervous/anxious.     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/17/2019    Allergies  Allergen Reactions   Tramadol Other (See Comments)    MIGRAINES   Aspirin Diarrhea   Naproxen Diarrhea    Past Surgical History:  Procedure Laterality Date   ABDOMINAL HYSTERECTOMY  12/209   partial- still has cervix and need paps - endometriosis   CARPAL TUNNEL RELEASE Right    CATARACT EXTRACTION, BILATERAL Bilateral    FRACTURE SURGERY Right    tib-fib   HERNIA REPAIR Left    Inguinal   REPEAT CESAREAN SECTION     TOTAL KNEE ARTHROPLASTY Right 2013    Social History   Tobacco Use   Smoking status: Some Days    Packs/day: 0.15    Years: 10.00    Total pack years: 1.50    Types: Cigarettes   Smokeless  tobacco: Never   Tobacco comments:    3 cigs daily- trying to quit - 2023  Vaping Use   Vaping Use: Never used  Substance Use Topics   Alcohol use: Never   Drug use: Never     Medication list has been reviewed and updated.  Current Meds  Medication Sig   acetaminophen (TYLENOL) 650 MG CR tablet Take 650 mg by mouth every 8 (eight) hours as needed for pain.   albuterol (PROVENTIL) (2.5 MG/3ML) 0.083% nebulizer solution Take 3 mLs (2.5 mg total) by nebulization every 6 (six) hours as needed for wheezing or shortness of breath.   albuterol (VENTOLIN HFA) 108 (90 Base) MCG/ACT inhaler Inhale 2 puffs into the lungs every 6 (six) hours as needed for  wheezing or shortness of breath.   fluticasone-salmeterol (WIXELA INHUB) 500-50 MCG/ACT AEPB Inhale 1 puff into the lungs in the morning and at bedtime.   omeprazole (PRILOSEC) 40 MG capsule Take 1 capsule (40 mg total) by mouth daily.   promethazine-dextromethorphan (PROMETHAZINE-DM) 6.25-15 MG/5ML syrup Take 5 mLs by mouth 4 (four) times daily as needed for cough.       10/09/2022    2:09 PM 08/16/2022    1:47 PM 05/17/2022   11:10 AM 12/10/2021    3:00 PM  GAD 7 : Generalized Anxiety Score  Nervous, Anxious, on Edge 0 0 0 0  Control/stop worrying 0 0 0 0  Worry too much - different things 0 0 0 0  Trouble relaxing 0 0 0 0  Restless 0 0 0 0  Easily annoyed or irritable 0 0 0 0  Afraid - awful might happen  0 0 0  Total GAD 7 Score  0 0 0  Anxiety Difficulty Not difficult at all Not difficult at all Not difficult at all Not difficult at all       10/09/2022    2:09 PM 08/16/2022    1:47 PM 05/17/2022   11:10 AM  Depression screen PHQ 2/9  Decreased Interest 0 0 0  Down, Depressed, Hopeless 0 0 0  PHQ - 2 Score 0 0 0  Altered sleeping 1 2 0  Tired, decreased energy 2 1 0  Change in appetite 0 0 0  Feeling bad or failure about yourself  2 0 0  Trouble concentrating 2 0 0  Moving slowly or fidgety/restless 0 0 0  Suicidal thoughts 0 0 0  PHQ-9 Score 7 3 0  Difficult doing work/chores Not difficult at all Not difficult at all Not difficult at all    BP Readings from Last 3 Encounters:  10/09/22 126/74  10/03/22 118/73  09/18/22 130/84    Physical Exam Vitals and nursing note reviewed.  Constitutional:      General: She is not in acute distress.    Appearance: She is well-developed.  HENT:     Head: Normocephalic and atraumatic.  Cardiovascular:     Rate and Rhythm: Normal rate and regular rhythm.  Pulmonary:     Effort: Pulmonary effort is normal. No respiratory distress.     Breath sounds: Decreased breath sounds present. No wheezing or rhonchi.  Musculoskeletal:         General: Normal range of motion.     Cervical back: Normal range of motion.     Right lower leg: No edema.     Left lower leg: No edema.  Lymphadenopathy:     Cervical: No cervical adenopathy.  Skin:    General: Skin is warm  and dry.     Findings: No rash.  Neurological:     General: No focal deficit present.     Mental Status: She is alert and oriented to person, place, and time.  Psychiatric:        Mood and Affect: Mood normal.        Behavior: Behavior normal.     Wt Readings from Last 3 Encounters:  10/09/22 185 lb 9.6 oz (84.2 kg)  10/03/22 174 lb (78.9 kg)  09/18/22 174 lb (78.9 kg)    BP 126/74   Pulse 89   Ht 5\' 2"  (1.575 m)   Wt 185 lb 9.6 oz (84.2 kg)   SpO2 94%   BMI 33.95 kg/m   Assessment and Plan: Problem List Items Addressed This Visit       Respiratory   Mild persistent asthma without complication (Chronic)    Recurrent infections - pneumonia and sinusitis Continue Wixela and albuterol Will give Rx for nebulizer; refer to Pulmonary FMLA completed      Relevant Medications   fluticasone-salmeterol (WIXELA INHUB) 500-50 MCG/ACT AEPB   Other Relevant Orders   Ambulatory referral to Pulmonology   Other Visit Diagnoses     Community acquired pneumonia of right middle lobe of lung    -  Primary   second bout in the past 2 months FMLA for this episode and future recommend out of work 2/8-10   Relevant Medications   fluticasone-salmeterol (WIXELA INHUB) 500-50 MCG/ACT AEPB   Other Relevant Orders   Ambulatory referral to Pulmonology        Partially dictated using Dragon software. Any errors are unintentional.  Halina Maidens, MD [facility]  10/09/2022

## 2022-10-14 ENCOUNTER — Other Ambulatory Visit: Payer: Self-pay | Admitting: Internal Medicine

## 2022-10-14 DIAGNOSIS — J45901 Unspecified asthma with (acute) exacerbation: Secondary | ICD-10-CM

## 2022-10-16 ENCOUNTER — Ambulatory Visit: Admitting: Internal Medicine

## 2022-10-22 ENCOUNTER — Ambulatory Visit: Admitting: Internal Medicine

## 2022-11-01 ENCOUNTER — Ambulatory Visit (INDEPENDENT_AMBULATORY_CARE_PROVIDER_SITE_OTHER): Admitting: Student in an Organized Health Care Education/Training Program

## 2022-11-01 ENCOUNTER — Encounter: Payer: Self-pay | Admitting: Student in an Organized Health Care Education/Training Program

## 2022-11-01 VITALS — BP 124/80 | HR 77 | Temp 97.8°F | Ht 62.0 in | Wt 184.0 lb

## 2022-11-01 DIAGNOSIS — J4530 Mild persistent asthma, uncomplicated: Secondary | ICD-10-CM | POA: Diagnosis not present

## 2022-11-01 DIAGNOSIS — R0602 Shortness of breath: Secondary | ICD-10-CM

## 2022-11-01 DIAGNOSIS — F17200 Nicotine dependence, unspecified, uncomplicated: Secondary | ICD-10-CM | POA: Diagnosis not present

## 2022-11-01 MED ORDER — NICOTINE POLACRILEX 2 MG MT LOZG: 2.0000 mg | LOZENGE | OROMUCOSAL | 3 refills | Status: AC | PRN

## 2022-11-01 NOTE — Progress Notes (Signed)
Synopsis: Referred in for shortness of breath by Glean Hess, MD  Assessment & Plan:   1. Shortness of breath 2. Mild persistent asthma without complication 3. Tobacco use Disorder  Reports symptoms of shortness of breath, cough, and wheeze in the setting of known history of asthma. She is compliant with Advair which had, until recently, provided her with excellent control. Other findings include nasal drainage as well as GERD. She is a smoker and is attempting to quit.  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 I will obtain PFT's to assess the degree of obstruction and also obtain a high resolution CT scan of the chest to rule out an idiopathic interstitial pneumonia. I will obtain ANCA profile to assess for the possibility of GPA/EGPA. Finally, will obtain an allergen panel and a CBC with differential to assess for Th response, eosinophilia, and measure her IgE (ABPA is on the differential).  I did counsel the patient on the importance of smoking cessation and have provided her with an informational packet as well as a nicotine lozenge prescription. She will attempt to quit. Furthermore, I did counsel her on dietary and lifestyle changes to help with her reflux disease, especially seeing that the symptoms are worse at night.  - Pulmonary Function Test [facility] Only; Future - Allergen Panel (27) + IGE - CBC with Differential/Platelet - nicotine polacrilex (NICOTINE MINI) 2 MG lozenge; Take 1 lozenge (2 mg total) by mouth every 2 (two) hours as needed for smoking cessation.  Dispense: 72 lozenge; Refill: 3 - CT CHEST HIGH RESOLUTION; Future - ANCA profile - continue Advair twice daily   Return in about 3 months (around 02/01/2023).  I spent 45 minutes caring for this patient today, including preparing to see the patient, obtaining a medical history , reviewing a separately obtained  history, performing a medically appropriate examination and/or evaluation, counseling and educating the patient/family/caregiver, ordering medications, tests, or procedures, and documenting clinical information in the electronic health record  Armando Reichert, MD [facility] 11/01/2022 10:11 AM    End of visit medications:  Meds ordered this encounter  Medications   nicotine polacrilex (NICOTINE MINI) 2 MG lozenge    Sig: Take 1 lozenge (2 mg total) by mouth every 2 (two) hours as needed for smoking cessation.    Dispense:  72 lozenge    Refill:  3     Current Outpatient Medications:    acetaminophen (TYLENOL) 650 MG CR tablet, Take 650 mg by mouth every 8 (eight) hours as needed for pain., Disp: , Rfl:    albuterol (PROVENTIL) (2.5 MG/3ML) 0.083% nebulizer solution, Take 3 mLs (2.5 mg total) by nebulization every 6 (six) hours as needed for wheezing or shortness of breath., Disp: 75 mL, Rfl: 1   albuterol (VENTOLIN HFA) 108 (90 Base) MCG/ACT inhaler, Inhale 2 puffs into the lungs every 6 (six) hours as needed for wheezing or shortness of breath., Disp: 18 g, Rfl: 2   fluticasone-salmeterol (WIXELA INHUB) 500-50 MCG/ACT AEPB, Inhale 1 puff into the lungs in the morning and at bedtime., Disp: , Rfl:    nicotine polacrilex (NICOTINE MINI) 2 MG lozenge, Take 1 lozenge (2 mg total) by mouth every 2 (two) hours as needed for smoking cessation., Disp: 72 lozenge, Rfl: 3   omeprazole (PRILOSEC) 40 MG capsule, TAKE 1 CAPSULE (40 MG TOTAL) BY MOUTH DAILY., Disp: 30 capsule, Rfl: 1  promethazine-dextromethorphan (PROMETHAZINE-DM) 6.25-15 MG/5ML syrup, Take 5 mLs by mouth 4 (four) times daily as needed for cough., Disp: 118 mL, Rfl: 0   Subjective:   PATIENT ID: Tamara George GENDER: female DOB: 02-07-1961, MRN: [id number]  Chief Complaint  Patient presents with   pulmonary consult    Hx of asthma and PNA-SOB with exertion, prod cough with clear sputum and wheezing     HPI  Ms. Palmberg is a pleasant 62 year old female who presents to clinic for the evaluation of shortness of breath.  She reports having a lifelong history of asthma which was previously under excellent control. She noticed symptoms of cough, shortness of breath, and wheezing a few months ago tha tprompted her to present to urgent care. She was treated for pneumonia with antibiotics and also received a course of steroids. This happened again in February of this year prompting similar treatment. CXR had previously shown an opacity in the RML. 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 She reports a cough that is at times productive of sputum, and wheezing that is sporadic. Her symptoms are worse at night. The symptoms are improved with the use of albuterol. She does not have any fevers, chills, night sweats, or weight loss. There is no lower extremity edema and no report of rash. She feels her sinuses are affected and she's been using Flonase for this. She reports a history of heart burn for which she takes omeprazole. She does enjoy spicy foods, tomato based products, coffee, and chocolate.  She is originally from [place], [place]. She started smoking cigarettes at the age of 43, and is currently smoking 3 to 4 cigarettes a day. She works at a [employer].  Ancillary information including prior medications, full medical/surgical/family/social histories, and PFTs (when available) are listed below and have been reviewed.   Review of Systems  Constitutional:  Negative for chills, fever and malaise/fatigue.  Respiratory:  Positive for cough, sputum production, shortness of breath and wheezing. Negative for hemoptysis.   Cardiovascular:  Negative for chest pain, leg swelling and PND.  Skin:  Negative for rash.     Objective:   Vitals:   11/01/22 0947  BP: 124/80  Pulse: 77  Temp: 97.8 F (36.6 C)   TempSrc: Temporal  SpO2: 100%  Weight: 184 lb (83.5 kg)  Height: '5\' 2"'$  (1.575 m)   100% on RA  BMI Readings from Last 3 Encounters:  11/01/22 33.65 kg/m  10/09/22 33.95 kg/m  10/03/22 31.83 kg/m   Wt Readings from Last 3 Encounters:  11/01/22 184 lb (83.5 kg)  10/09/22 185 lb 9.6 oz (84.2 kg)  10/03/22 174 lb (78.9 kg)    Physical Exam Constitutional:      Appearance: Normal appearance. She is obese. She is not ill-appearing.  HENT:     Head: Normocephalic.     Nose: Nose normal.     Mouth/Throat:     Mouth: Mucous membranes are moist.  Cardiovascular:     Rate and Rhythm: Normal rate and regular rhythm.     Pulses: Normal pulses.     Heart sounds: Normal heart sounds.  Pulmonary:     Effort: Pulmonary effort is normal. No respiratory distress.     Breath sounds: Normal breath sounds. No wheezing, rhonchi or rales.  Abdominal:     Palpations: Abdomen is soft.  Neurological:     General: No focal  deficit present.     Mental Status: She is alert and oriented to person, place, and time. Mental status is at baseline.     Ancillary Information    Past Medical History:  Diagnosis Date   Asthma      Family History  Problem Relation Age of Onset   Hypertension Other    Hypertension Maternal Aunt    Diabetes Maternal Aunt    Diabetes Maternal Uncle    Lung cancer Father    Diabetes Maternal Grandfather      Past Surgical History:  Procedure Laterality Date   ABDOMINAL HYSTERECTOMY  12/209   partial- still has cervix and need paps - endometriosis   CARPAL TUNNEL RELEASE Right    CATARACT EXTRACTION, BILATERAL Bilateral    FRACTURE SURGERY Right    tib-fib   HERNIA REPAIR Left    Inguinal   REPEAT CESAREAN SECTION     TOTAL KNEE ARTHROPLASTY Right 2013    Social History   Socioeconomic History   Marital status: Single    Spouse name: Not on file   Number of children: 4   Years of education: Not on file   Highest education level: Not on file   Occupational History   Not on file  Tobacco Use   Smoking status: Every Day    Packs/day: 0.50    Years: 25.00    Total pack years: 12.50    Types: Cigarettes   Smokeless tobacco: Never   Tobacco comments:    4 cigarettes daily-11/01/2022  Vaping Use   Vaping Use: Never used  Substance and Sexual Activity   Alcohol use: Never   Drug use: Never   Sexual activity: Yes  Other Topics Concern   Not on file  Social History Narrative   Not on file   Social Determinants of Health   Financial Resource Strain: Low Risk  (08/16/2022)   Overall Financial Resource Strain (CARDIA)    Difficulty of Paying Living Expenses: Not hard at all  Food Insecurity: No Food Insecurity (08/16/2022)   Hunger Vital Sign    Worried About Running Out of Food in the Last Year: Never true    Ran Out of Food in the Last Year: Never true  Transportation Needs: No Transportation Needs (08/16/2022)   PRAPARE - Hydrologist (Medical): No    Lack of Transportation (Non-Medical): No  Physical Activity: Not on file  Stress: Not on file  Social Connections: Not on file  Intimate Partner Violence: Not At Risk (08/16/2022)   Humiliation, Afraid, Rape, and Kick questionnaire    Fear of Current or Ex-Partner: No    Emotionally Abused: No    Physically Abused: No    Sexually Abused: No     Allergies  Allergen Reactions   Tramadol Other (See Comments)    MIGRAINES   Aspirin Diarrhea   Naproxen Diarrhea     CBC    Component Value Date/Time   WBC 6.6 07/30/2022 0907   RBC 4.32 07/30/2022 0907   RBC 4.65 05/22/2018 0000   HGB 12.9 07/30/2022 0907   HCT 38.6 07/30/2022 0907   PLT 323 07/30/2022 0907   MCV 89 07/30/2022 0907   MCH 29.9 07/30/2022 0907   MCHC 33.4 07/30/2022 0907   RDW 12.8 07/30/2022 0907   LYMPHSABS 2.9 07/30/2022 0907   EOSABS 0.2 07/30/2022 0907   BASOSABS 0.1 07/30/2022 [id number]    Pulmonary Functions Testing Results:     No data to  display           Outpatient Medications Prior to Visit  Medication Sig Dispense Refill   acetaminophen (TYLENOL) 650 MG CR tablet Take 650 mg by mouth every 8 (eight) hours as needed for pain.     albuterol (PROVENTIL) (2.5 MG/3ML) 0.083% nebulizer solution Take 3 mLs (2.5 mg total) by nebulization every 6 (six) hours as needed for wheezing or shortness of breath. 75 mL 1   albuterol (VENTOLIN HFA) 108 (90 Base) MCG/ACT inhaler Inhale 2 puffs into the lungs every 6 (six) hours as needed for wheezing or shortness of breath. 18 g 2   fluticasone-salmeterol (WIXELA INHUB) 500-50 MCG/ACT AEPB Inhale 1 puff into the lungs in the morning and at bedtime.     omeprazole (PRILOSEC) 40 MG capsule TAKE 1 CAPSULE (40 MG TOTAL) BY MOUTH DAILY. 30 capsule 1   promethazine-dextromethorphan (PROMETHAZINE-DM) 6.25-15 MG/5ML syrup Take 5 mLs by mouth 4 (four) times daily as needed for cough. 118 mL 0   No facility-administered medications prior to visit.

## 2022-11-01 NOTE — Patient Instructions (Signed)
The [facility] Quitline: Call 1-800-QUIT-NOW 575-707-4353). The [place] Quitline is a free service for [employer]. Trained counselors are available from 8 am until 3 am, 365 days per year. Services are available in both [place] and [place].   Web Resources Free online support programs can help you track your progress and share experiences with others who are quitting. These are examples: www.becomeanex.org www.trytostop.org  www.smokefree.gov  www.[employer].com.br.aspx  [facility]: offers comprehensive in-person tobacco treatment counseling at [facility] ([address], [place] [address]).  Open to everyone. Virtual appointments available. Free parking. Call [phone] to schedule an appointment or [phone] for general information.    Tobacco Cessation Medications  Nicotine Replacement Therapy (NRT)  Nicotine is the addictive part of tobacco smoke, but not the most dangerous part. There are 7000 other toxins in cigarettes, including carbon monoxide, that cause disease. People do not generally become addicted to medication. Common problems: People don't use enough medication or stop too early. Medications are safe and effective. Overdose is very uncommon. Use medications as long as needed (3 months minimum). Some combinations work better than single medications. Long acting medications like the NRT patch and bupropion provide continuous treatment for withdrawal symptoms.  PLUS  Short acting medications like the NRT gum, lozenge, inhaler, and nasal spray help people to cope with breakthrough cravings.  ? Nicotine Patch  Place patch on hairless skin on upper body, including arms and back. Each day: discard old patch, shower, apply new patch to a different site. Apply hydrocortisone cream to mildly red/irritated areas. Call provider if rash develops. If patch causes sleep disturbance, remove patch  at bedtime and replace each morning after shower. Side effects may include: skin irritation, headache, insomnia, abnormal/vivid dreams.  ? Nicotine Gum  Chew gum slowly, park in cheek when peppery taste or tingling sensation begins (about 15-30 chews). When taste or tingling goes away, begin chewing again. Use until nicotine is gone (taste or tingle does not return, usually 30 minutes). Park in different areas of mouth. Nicotine is absorbed through the lining of the mouth. Use enough to control cravings, up to 24 pieces per day (if used alone). Avoid eating or drinking for 15 minutes before using and during use. Side effects may include: mouth/jaw soreness, hiccups, indigestion, hypersalivation.  If gum is not chewed correctly, additional side effects may include lightheadedness, nausea/vomiting, throat and mouth irritation.  ? Nicotine Lozenge  Allow to dissolve slowly in mouth (20-30 minutes). Do not chew or swallow. Nicotine release may cause a warm tingling sensation. Occasionally rotate to different areas of the mouth. Use enough to control cravings, up to 20 lozenges per day (if used alone). Avoid eating or drinking for 15 minutes before using and during use. Side effects may include: nausea, hiccups, cough, heartburn, headache, gas, insomnia.  ? Nicotine Nasal Spray Use 1 spray in each nostril (1 dose) and tilt head back for 1 minute. Do not sniff, swallow, or inhale through nose.  Use at least 8 doses (1 spray in each nostril) , up to 40 doses per day (if used alone). To reduce nasal irritation, spray on cotton swab and insert into nose. Side effects may include: nasal and/or throat irritation (hot, peppery, or burning sensation), nasal irritation, tearing, sneezing, cough, headache.  ? Nicotine Oral Inhaler (puffer) Inhale into the back of the throat or puff in short breaths. Do not inhale into the lungs.  Puff continuously for 20 minutes (about 80 puffs) until cartridge  is  empty. Change cartridge when it loses the "burning in throat" sensation (feels like air only). Open cartridges can be saved and used again within 24 hours. Use at least 6 and up to 16 cartridges per day (if used alone).  Avoid eating or drinking for 15 minutes before using and during use. Side effects may include: mouth and/or throat irritation, unpleasant taste, cough, nasal irritation, indigestion, hiccups, headache.  ? Chantix (varenicline) Days 1-3: Take one 0.5 mg white pill each morning for 3 days, one week before quit date. Days 4-7: Increase to one 0.5 mg white pill twice a day in morning and evening for 4 days.  On Day 8 (target quit date), increase to one 1 mg blue pill twice a day. Maintain this dose for a minimum of 3 months. Take with food and a full glass of water to reduce nausea. Be sure that the two doses are at least 8 hours apart, but try to take second dose early in the evening (i.e. 6 pm) to avoid sleep problems. Common side effects include: nausea, insomnia, headache, abnormal/vivid dreams. Tell your doctor if you have any history of psychiatric illness prior to starting Chantix.  STOP taking CHANTIX and contact a healthcare provider immediately if you experience agitation, hostility, depressed mood, changes in thoughts or behavior that are not typical for you, thinking about or attempting suicide, allergic or skin reactions including swelling, rash, redness, or peeling of the skin.  For patients who have heart disease: Smoking is a major risk factor for cardiovascular disease, and Chantix can help you quit smoking. Chantix may be associated with a small, increased risk of certain heart events in patients who have heart disease. If you have any new or worsening symptoms of heart disease while taking Chantix, such as shortness of breath or trouble breathing, new or worsening chest pain, or new or worsening pain in your legs when walking, call your doctor or get emergency medical  help immediately.  ? Wellbutrin / Zyban (bupropion) Take one 150 mg pill each morning for 3 days, one week before target quit date. On Day 4, increase to one 150 mg pill twice a day, morning and evening.  Maintain this dose for a minimum of 3 months. Be sure that the two doses are at least 8 hours apart, but try to take second dose early in the evening (i.e. 6 pm) to avoid sleep problems. Avoid or minimize use of alcohol when taking this medication. Common side effects include: dry mouth, headache, insomnia, nausea, weight loss.  Risk of seizure is 09/998. STOP taking BUPROPION and contact a healthcare provider immediately if you experience agitation, hostility, depressed mood, changes in thoughts or behavior that are not typical for you, thinking about or attempting suicide, allergic or skin reactions including swelling, rash, redness, or peeling of the skin.

## 2022-11-13 ENCOUNTER — Other Ambulatory Visit

## 2022-11-13 ENCOUNTER — Ambulatory Visit

## 2022-12-11 ENCOUNTER — Other Ambulatory Visit: Payer: Self-pay | Admitting: Student in an Organized Health Care Education/Training Program

## 2022-12-11 DIAGNOSIS — R0602 Shortness of breath: Secondary | ICD-10-CM

## 2022-12-18 ENCOUNTER — Ambulatory Visit
Admission: RE | Admit: 2022-12-18 | Discharge: 2022-12-18 | Disposition: A | Discharge status: Home / Self Care | Source: Ambulatory Visit | Attending: Student in an Organized Health Care Education/Training Program | Admitting: Student in an Organized Health Care Education/Training Program

## 2022-12-18 DIAGNOSIS — R0602 Shortness of breath: Secondary | ICD-10-CM

## 2022-12-20 LAB — ANCA PROFILE
Anti-MPO Antibodies: <0.2 units (ref 0.0–0.9)
Anti-PR3 Antibodies: <0.2 units (ref 0.0–0.9)
Atypical pANCA: <1:20 {titer}
C-ANCA: <1:20 {titer}
P-ANCA: <1:20 {titer}

## 2022-12-23 LAB — ALLERGEN PANEL (27) + IGE
Alternaria Alternata IgE: 0.11 kU/L — AB
Aspergillus Fumigatus IgE: 0.14 kU/L — AB
Bahia Grass IgE: 3.84 kU/L — AB
Bermuda Grass IgE: 1.51 kU/L — AB
Cat Dander IgE: 0.69 kU/L — AB
Cedar, Mountain IgE: 20.7 kU/L — AB
Cladosporium Herbarum IgE: <0.1 kU/L
Cocklebur IgE: 0.27 kU/L — AB
Cockroach, American IgE: <0.1 kU/L
Common Silver Birch IgE: 0.27 kU/L — AB
D Farinae IgE: 0.26 kU/L — AB
D Pteronyssinus IgE: 0.24 kU/L — AB
Dog Dander IgE: 0.68 kU/L — AB
Elm, American IgE: 9.45 kU/L — AB
Hickory, White IgE: 5.15 kU/L — AB
IgE (Immunoglobulin E), Serum: 2237 IU/mL — ABNORMAL HIGH (ref 6–495)
Johnson Grass IgE: 2.18 kU/L — AB
Kentucky Bluegrass IgE: 4.07 kU/L — AB
Maple/Box Elder IgE: 0.25 kU/L — AB
Mucor Racemosus IgE: <0.1 kU/L
Oak, White IgE: 0.28 kU/L — AB
Penicillium Chrysogen IgE: <0.1 kU/L
Pigweed, Rough IgE: 0.12 kU/L — AB
Plantain, English IgE: 0.18 kU/L — AB
Ragweed, Short IgE: 0.42 kU/L — AB
Setomelanomma Rostrat: 1.87 kU/L — AB
Timothy Grass IgE: 2.46 kU/L — AB
White Mulberry IgE: 0.28 kU/L — AB

## 2022-12-23 LAB — CBC WITH DIFFERENTIAL/PLATELET
Basophils Absolute: 0.1 10*3/uL (ref 0.0–0.2)
Basos: 1 %
EOS (ABSOLUTE): 0.1 10*3/uL (ref 0.0–0.4)
Eos: 1 %
Hematocrit: 39.7 % (ref 34.0–46.6)
Hemoglobin: 13.2 g/dL (ref 11.1–15.9)
Immature Grans (Abs): 0 10*3/uL (ref 0.0–0.1)
Immature Granulocytes: 0 %
Lymphocytes Absolute: 2.4 10*3/uL (ref 0.7–3.1)
Lymphs: 39 %
MCH: 29.3 pg (ref 26.6–33.0)
MCHC: 33.2 g/dL (ref 31.5–35.7)
MCV: 88 fL (ref 79–97)
Monocytes Absolute: 0.5 10*3/uL (ref 0.1–0.9)
Monocytes: 8 %
Neutrophils Absolute: 3 10*3/uL (ref 1.4–7.0)
Neutrophils: 51 %
Platelets: 320 10*3/uL (ref 150–450)
RBC: 4.5 x10E6/uL (ref 3.77–5.28)
RDW: 12.7 % (ref 11.7–15.4)
WBC: 6 10*3/uL (ref 3.4–10.8)

## 2023-01-08 ENCOUNTER — Other Ambulatory Visit: Payer: Self-pay | Admitting: Internal Medicine

## 2023-01-08 ENCOUNTER — Ambulatory Visit
Admission: RE | Admit: 2023-01-08 | Discharge: 2023-01-08 | Disposition: A | Discharge status: Home / Self Care | Payer: BC Managed Care – PPO | Source: Ambulatory Visit | Attending: Internal Medicine | Admitting: Internal Medicine

## 2023-01-08 DIAGNOSIS — N6452 Nipple discharge: Secondary | ICD-10-CM | POA: Diagnosis not present

## 2023-01-08 DIAGNOSIS — R928 Other abnormal and inconclusive findings on diagnostic imaging of breast: Secondary | ICD-10-CM

## 2023-01-08 DIAGNOSIS — N63 Unspecified lump in unspecified breast: Secondary | ICD-10-CM

## 2023-01-15 ENCOUNTER — Encounter

## 2023-01-15 ENCOUNTER — Other Ambulatory Visit

## 2023-01-15 ENCOUNTER — Inpatient Hospital Stay: Admission: RE | Admit: 2023-01-15 | Source: Ambulatory Visit

## 2023-01-29 ENCOUNTER — Ambulatory Visit

## 2023-02-05 ENCOUNTER — Ambulatory Visit: Admitting: Student in an Organized Health Care Education/Training Program

## 2023-02-17 ENCOUNTER — Telehealth: Payer: Self-pay

## 2023-02-17 NOTE — Telephone Encounter (Signed)
Bonney Leitz, CMA 02/17/2023 11:55 AM EDT Back to Top    I have notified the patient and scheduled her an appt for 02/21/2023 to see Dr. Aundria Rud.   Nothing further needed.   Raechel Chute, MD 02/17/2023 11:12 AM EDT     Similarly left this as a mychart comment but patient didn't review   You have significant allergies on this allergy test. This could be causing your asthma to be poorly controlled. We will need to discuss this further at follow up.

## 2023-02-17 NOTE — Telephone Encounter (Signed)
I the patient asked that we cancel her PFT appt for tomorrow. She said she is already having to pay a lot with all the test that have been ordered. She does not want to have the PFT done.   Synetta Fail can you please cancel this appt for her?

## 2023-02-17 NOTE — Telephone Encounter (Signed)
This is the 2nd time she has CXL the PFT appt. I spoke with Britta Mccreedy with scheduling and she has CXL PFT appt

## 2023-02-18 ENCOUNTER — Ambulatory Visit

## 2023-02-21 ENCOUNTER — Ambulatory Visit: Admitting: Student in an Organized Health Care Education/Training Program

## 2023-02-26 ENCOUNTER — Ambulatory Visit: Admitting: Student in an Organized Health Care Education/Training Program

## 2023-04-12 ENCOUNTER — Ambulatory Visit

## 2023-05-21 ENCOUNTER — Encounter: Payer: Self-pay | Admitting: Internal Medicine

## 2024-01-21 ENCOUNTER — Other Ambulatory Visit: Payer: Self-pay | Admitting: Internal Medicine

## 2024-01-21 DIAGNOSIS — J4530 Mild persistent asthma, uncomplicated: Secondary | ICD-10-CM

## 2024-01-21 NOTE — Telephone Encounter (Signed)
 Copied from CRM [id number]. Topic: Clinical - Medication Refill >> Jan 21, 2024  2:43 PM Phil Braun wrote: Medication:  albuterol  (VENTOLIN  HFA) 108 (90 Base) MCG/ACT inhaler  WIXELA INHUB 250-50 MCG/ACT AEPB   Has the patient contacted their pharmacy? No  This is the patient's preferred pharmacy:   [facility] [phone] [place], [place] - [address] [place] [address] Phone: [phone] Fax: [phone]  Is this the correct pharmacy for this prescription? Yes If no, delete pharmacy and type the correct one.   Has the prescription been filled recently? Yes  Is the patient out of the medication? Yes  Has the patient been seen for an appointment in the last year OR does the patient have an upcoming appointment? Yes  Can we respond through MyChart? No  Agent: Please be advised that Rx refills may take up to 3 business days. We ask that you follow-up with your pharmacy.

## 2024-01-30 ENCOUNTER — Ambulatory Visit: Admitting: Internal Medicine

## 2024-02-04 ENCOUNTER — Ambulatory Visit: Admitting: Internal Medicine

## 2024-02-04 ENCOUNTER — Encounter: Payer: Self-pay | Admitting: Internal Medicine

## 2024-02-04 VITALS — BP 128/80 | HR 76 | Ht 62.0 in | Wt 181.4 lb

## 2024-02-04 DIAGNOSIS — J4530 Mild persistent asthma, uncomplicated: Secondary | ICD-10-CM

## 2024-02-04 DIAGNOSIS — F17200 Nicotine dependence, unspecified, uncomplicated: Secondary | ICD-10-CM

## 2024-02-04 MED ORDER — ALBUTEROL SULFATE HFA 108 (90 BASE) MCG/ACT IN AERS: 2.0000 | INHALATION_SPRAY | Freq: Four times a day (QID) | RESPIRATORY_TRACT | 5 refills | Status: DC | PRN

## 2024-02-04 MED ORDER — FLUTICASONE-SALMETEROL 500-50 MCG/ACT IN AEPB: 1.0000 | INHALATION_SPRAY | Freq: Two times a day (BID) | RESPIRATORY_TRACT | 5 refills | Status: DC

## 2024-02-04 MED ORDER — ALBUTEROL SULFATE (2.5 MG/3ML) 0.083% IN NEBU: 2.5000 mg | INHALATION_SOLUTION | Freq: Four times a day (QID) | RESPIRATORY_TRACT | 1 refills | Status: AC | PRN

## 2024-02-04 NOTE — Assessment & Plan Note (Signed)
 She is trying hard to cut back - now only smoking about 2 per day.

## 2024-02-04 NOTE — Progress Notes (Signed)
 Date:  02/04/2024   Name:  Tamara George   DOB:  [birth year]   MRN:  [id number]   Chief Complaint: Medication Refill (Asthma medications)  Asthma She complains of chest tightness, shortness of breath and wheezing. This is a recurrent problem. The problem has been unchanged. Pertinent negatives include no chest pain, fever, headaches or trouble swallowing. Her past medical history is significant for asthma.    Review of Systems  Constitutional:  Negative for chills, fatigue and fever.  HENT:  Negative for mouth sores and trouble swallowing.   Respiratory:  Positive for chest tightness, shortness of breath and wheezing.   Cardiovascular:  Negative for chest pain, palpitations and leg swelling.  Musculoskeletal:  Negative for arthralgias.  Neurological:  Negative for dizziness and headaches.  Psychiatric/Behavioral:  Negative for dysphoric mood and sleep disturbance. The patient is not nervous/anxious.      Lab Results  Component Value Date   NA 144 07/30/2022   K 4.4 07/30/2022   CO2 21 07/30/2022   GLUCOSE 89 07/30/2022   BUN 12 07/30/2022   CREATININE 0.69 07/30/2022   CALCIUM 9.4 07/30/2022   EGFR 99 07/30/2022   GFRNONAA 93 11/12/2019   Lab Results  Component Value Date   CHOL 236 (H) 07/30/2022   HDL 70 07/30/2022   LDLCALC 155 (H) 07/30/2022   TRIG 65 07/30/2022   CHOLHDL 3.4 07/30/2022   Lab Results  Component Value Date   TSH 0.771 07/30/2022   Lab Results  Component Value Date   HGBA1C 5.9 (H) 07/30/2022   Lab Results  Component Value Date   WBC 6.0 12/18/2022   HGB 13.2 12/18/2022   HCT 39.7 12/18/2022   MCV 88 12/18/2022   PLT 320 12/18/2022   Lab Results  Component Value Date   ALT 19 07/30/2022   AST 15 07/30/2022   ALKPHOS 103 07/30/2022   BILITOT 0.3 07/30/2022   Lab Results  Component Value Date   VD25OH 20.3 02/15/2016     Patient Active Problem List   Diagnosis Date Noted   S/P total hysterectomy 12/10/2021   Mild hyperlipidemia  05/17/2021   History of total hip arthroplasty 08/22/2020   Tobacco use disorder 11/12/2019   Arthropathy of both temporomandibular joints 11/12/2019   Corn of foot 11/12/2019   Generalized anxiety disorder 11/12/2019   Mild persistent asthma without complication 11/11/2019   Localized, primary osteoarthritis of shoulder region 05/17/2019    Allergies  Allergen Reactions   Tramadol Other (See Comments)    MIGRAINES   Aspirin Diarrhea   Naproxen Diarrhea    Past Surgical History:  Procedure Laterality Date   ABDOMINAL HYSTERECTOMY  12/209   partial- still has cervix and need paps - endometriosis   BREAST BIOPSY Left 2009   neg   CARPAL TUNNEL RELEASE Right    CATARACT EXTRACTION, BILATERAL Bilateral    FRACTURE SURGERY Right    tib-fib   HERNIA REPAIR Left    Inguinal   REPEAT CESAREAN SECTION     TOTAL KNEE ARTHROPLASTY Right 2013    Social History   Tobacco Use   Smoking status: Every Day    Types: Cigarettes   Smokeless tobacco: Never   Tobacco comments:    Down to 2 cigs per day  Vaping Use   Vaping status: Never Used  Substance Use Topics   Alcohol use: Never   Drug use: Never     Medication list has been reviewed and updated.  Current Meds  Medication Sig   acetaminophen (TYLENOL) 650 MG CR tablet Take 650 mg by mouth every 8 (eight) hours as needed for pain.   omeprazole  (PRILOSEC) 40 MG capsule TAKE 1 CAPSULE (40 MG TOTAL) BY MOUTH DAILY.   [DISCONTINUED] albuterol  (PROVENTIL ) (2.5 MG/3ML) 0.083% nebulizer solution Take 3 mLs (2.5 mg total) by nebulization every 6 (six) hours as needed for wheezing or shortness of breath.   [DISCONTINUED] albuterol  (VENTOLIN  HFA) 108 (90 Base) MCG/ACT inhaler Inhale 2 puffs into the lungs every 6 (six) hours as needed for wheezing or shortness of breath.   [DISCONTINUED] fluticasone -salmeterol (WIXELA INHUB) 500-50 MCG/ACT AEPB Inhale 1 puff into the lungs in the morning and at bedtime.       02/04/2024    2:43 PM  10/09/2022    2:09 PM 08/16/2022    1:47 PM 05/17/2022   11:10 AM  GAD 7 : Generalized Anxiety Score  Nervous, Anxious, on Edge 1 0 0 0  Control/stop worrying 1 0 0 0  Worry too much - different things 1 0 0 0  Trouble relaxing 1 0 0 0  Restless 1 0 0 0  Easily annoyed or irritable 1 0 0 0  Afraid - awful might happen 0  0 0  Total GAD 7 Score 6  0 0  Anxiety Difficulty Not difficult at all Not difficult at all Not difficult at all Not difficult at all       02/04/2024    2:43 PM 10/09/2022    2:09 PM 08/16/2022    1:47 PM  Depression screen PHQ 2/9  Decreased Interest 0 0 0  Down, Depressed, Hopeless 0 0 0  PHQ - 2 Score 0 0 0  Altered sleeping 2 1 2   Tired, decreased energy 1 2 1   Change in appetite 0 0 0  Feeling bad or failure about yourself  0 2 0  Trouble concentrating 0 2 0  Moving slowly or fidgety/restless 0 0 0  Suicidal thoughts 0 0 0  PHQ-9 Score 3 7 3   Difficult doing work/chores Not difficult at all Not difficult at all Not difficult at all    BP Readings from Last 3 Encounters:  02/04/24 128/80  11/01/22 124/80  10/09/22 126/74    Physical Exam Vitals and nursing note reviewed.  Constitutional:      General: She is not in acute distress.    Appearance: She is well-developed.  HENT:     Head: Normocephalic and atraumatic.  Cardiovascular:     Rate and Rhythm: Normal rate and regular rhythm.     Heart sounds: No murmur heard. Pulmonary:     Effort: Pulmonary effort is normal. No respiratory distress.     Breath sounds: No wheezing or rhonchi.  Musculoskeletal:     Right lower leg: No edema.     Left lower leg: No edema.  Lymphadenopathy:     Cervical: No cervical adenopathy.  Skin:    General: Skin is warm and dry.     Findings: No rash.  Neurological:     Mental Status: She is alert and oriented to person, place, and time.  Psychiatric:        Mood and Affect: Mood normal.        Behavior: Behavior normal.     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 Will continue current medications. She has had Prevnar20 recently - will get the date. Needs FMLA at work updated or corrected as it was denied last year.      Relevant Medications   fluticasone -salmeterol (WIXELA INHUB) 500-50 MCG/ACT AEPB   albuterol  (PROVENTIL ) (2.5 MG/3ML) 0.083% nebulizer solution   albuterol  (VENTOLIN  HFA) 108 (90 Base) MCG/ACT inhaler   Tobacco use disorder - Primary   She is trying hard to cut back - now only smoking about 2 per day.       No follow-ups on file.    Sheron Dixons, MD [facility] [place]

## 2024-02-04 NOTE — Assessment & Plan Note (Signed)
 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 Will continue current medications. She has had Prevnar20 recently - will get the date. Needs FMLA at work updated or corrected as it was denied last year.

## 2024-09-14 ENCOUNTER — Other Ambulatory Visit: Payer: Self-pay | Admitting: Internal Medicine

## 2024-09-14 ENCOUNTER — Telehealth: Payer: Self-pay

## 2024-09-14 DIAGNOSIS — J4530 Mild persistent asthma, uncomplicated: Secondary | ICD-10-CM

## 2024-09-14 MED ORDER — FLUTICASONE-SALMETEROL 500-50 MCG/ACT IN AEPB: 1.0000 | INHALATION_SPRAY | Freq: Two times a day (BID) | RESPIRATORY_TRACT | 0 refills | Status: DC

## 2024-09-14 NOTE — Telephone Encounter (Signed)
 Noted. Patient is scheduled

## 2024-09-14 NOTE — Telephone Encounter (Signed)
 Copied from CRM #[id number]. Topic: Clinical - Medication Refill >> Sep 14, 2024  8:10 AM Antony RAMAN wrote: Medication:  fluticasone -salmeterol (WIXELA INHUB) 500-50 MCG/ACT AEPB   Has the patient contacted their pharmacy? Yes (Agent: If no, request that the patient contact the pharmacy for the refill. If patient does not wish to contact the pharmacy document the reason why and proceed with request.) (Agent: If yes, when and what did the pharmacy advise?)  This is the patient's preferred pharmacy:    [facility] [phone] [address], [place] - [address] [place] [address] Phone: [phone] Fax: [phone]  Is this the correct pharmacy for this prescription? Yes If no, delete pharmacy and type the correct one.   Has the prescription been filled recently? No  Is the patient out of the medication? No  Has the patient been seen for an appointment in the last year OR does the patient have an upcoming appointment? Yes  Can we respond through MyChart? Yes  Agent: Please be advised that Rx refills may take up to 3 business days. We ask that you follow-up with your pharmacy.

## 2024-09-14 NOTE — Telephone Encounter (Signed)
 Copied from CRM #[id number]. Topic: Appointments - Transfer of Care >> Sep 14, 2024  8:14 AM Antony RAMAN wrote: Pt is requesting to transfer FROM: laura berglund Pt is requesting to transfer TO: harlene liang Reason for requested transfer: pt request It is the responsibility of the team the patient would like to transfer to (Dr. lemon) to reach out to the patient if for any reason this transfer is not acceptable.

## 2024-09-30 ENCOUNTER — Telehealth: Payer: Self-pay | Admitting: Internal Medicine

## 2024-09-30 NOTE — Telephone Encounter (Signed)
 Copied from CRM #[id number]. Topic: General - Other >> Sep 30, 2024  9:25 AM Tiffany B wrote: Reason for CRM: [facility] faxed over forms for intermittent leave on 09/14/2024 due to respiratory condition, deadline for forms to be completed Feb 4. As per CAL forms were received. Advised caller to contact [facility] and request an extension. Patient voiced understanding and patient has been placed on the wait list for Dr. Lemon (patient prefers an internist and a female provider)

## 2024-09-30 NOTE — Telephone Encounter (Signed)
 Noted.

## 2024-09-30 NOTE — Telephone Encounter (Signed)
 Please review. Patient is scheduled for TOC until March.

## 2024-10-06 ENCOUNTER — Encounter: Payer: Self-pay | Admitting: Student

## 2024-10-06 ENCOUNTER — Ambulatory Visit: Admitting: Student

## 2024-10-06 ENCOUNTER — Other Ambulatory Visit: Payer: Self-pay | Admitting: Student

## 2024-10-06 VITALS — BP 118/76 | HR 80 | Temp 98.0°F | Ht 62.0 in | Wt 178.1 lb

## 2024-10-06 DIAGNOSIS — J45901 Unspecified asthma with (acute) exacerbation: Secondary | ICD-10-CM

## 2024-10-06 DIAGNOSIS — J439 Emphysema, unspecified: Secondary | ICD-10-CM | POA: Insufficient documentation

## 2024-10-06 DIAGNOSIS — N6320 Unspecified lump in the left breast, unspecified quadrant: Secondary | ICD-10-CM | POA: Insufficient documentation

## 2024-10-06 DIAGNOSIS — J4530 Mild persistent asthma, uncomplicated: Secondary | ICD-10-CM

## 2024-10-06 DIAGNOSIS — F17200 Nicotine dependence, unspecified, uncomplicated: Secondary | ICD-10-CM

## 2024-10-06 DIAGNOSIS — K219 Gastro-esophageal reflux disease without esophagitis: Secondary | ICD-10-CM | POA: Insufficient documentation

## 2024-10-06 DIAGNOSIS — N6323 Unspecified lump in the left breast, lower outer quadrant: Secondary | ICD-10-CM

## 2024-10-06 MED ORDER — VARENICLINE TARTRATE (STARTER) 0.5 MG X 11 & 1 MG X 42 PO TBPK: ORAL_TABLET | ORAL | 0 refills | Status: AC

## 2024-10-06 MED ORDER — ALBUTEROL SULFATE HFA 108 (90 BASE) MCG/ACT IN AERS: 2.0000 | INHALATION_SPRAY | Freq: Four times a day (QID) | RESPIRATORY_TRACT | 5 refills | Status: AC | PRN

## 2024-10-06 MED ORDER — VARENICLINE TARTRATE(CONTINUE) 1 MG PO TABS: 1.0000 mg | ORAL_TABLET | Freq: Two times a day (BID) | ORAL | 1 refills | Status: AC

## 2024-10-06 MED ORDER — OMEPRAZOLE 40 MG PO CPDR: 40.0000 mg | DELAYED_RELEASE_CAPSULE | Freq: Every day | ORAL | 1 refills | Status: AC

## 2024-10-06 MED ORDER — FLUTICASONE-SALMETEROL 500-50 MCG/ACT IN AEPB: 1.0000 | INHALATION_SPRAY | Freq: Two times a day (BID) | RESPIRATORY_TRACT | 5 refills | Status: AC

## 2024-10-06 NOTE — Assessment & Plan Note (Signed)
 Noted on CT in 2024, she will call to schedule with pulm. Working on smoking cessation see tobacco use disorder.

## 2024-10-06 NOTE — Assessment & Plan Note (Addendum)
 Has has asthma triggered by smoke from cooking oil at work, complete FLMA for her. Works in surveyor, mining at [employer].  Currently using Wixela twice daily  and albuterol , is using this about 2 times a week. Not using regularly. Has not followed up with pulm or have PFTs completed in 2024. Recommended using wixela regularly, continue albuterol  as needed, working on smoking cessation.

## 2024-10-06 NOTE — Assessment & Plan Note (Addendum)
 Is out of omeprazole , sx are well controlled on omeprazole . Denies dypsphgaia, melana, or unexpected weight loss. Refilled omeprazole  40 mg daily.

## 2024-10-06 NOTE — Assessment & Plan Note (Signed)
 8 mm intraductal mass in the 5 o'clock retroareolar region of the left breast. Noted on diagnostic mammogram and US  on 01/08/2023, recommended breast biopsy. She denies breast pain, palpable mass, skin changes, or drainage. Deferred breast exam today. Mammogram and US  ordered She will call to have this scheduled

## 2024-10-06 NOTE — Assessment & Plan Note (Addendum)
 Smoking 3 cigarettes a day, interested in treatment. Start on chantix . She will consider a quit date. Follow up in 1 month.

## 2024-10-06 NOTE — Progress Notes (Signed)
 "  Established Patient Office Visit  Subjective   Patient ID: Tamara George, female    DOB: [birth year]  Age: 64 y.o. MRN: [id number]  Chief Complaint  Patient presents with   Asthma    Tamara George is a 64 y.o. person with medical hx listed below who presents today for asthma follow up. Please refer to problem based charting for further details and assessment and plan of current problem and chronic medical conditions.  Patient Active Problem List   Diagnosis Date Noted   GERD (gastroesophageal reflux disease) 10/06/2024   Emphysema lung (HCC) 10/06/2024   Breast mass, left 10/06/2024   S/P total hysterectomy 12/10/2021   Mild hyperlipidemia 05/17/2021   History of total hip arthroplasty 08/22/2020   Tobacco use disorder 11/12/2019   Arthropathy of both temporomandibular joints 11/12/2019   Generalized anxiety disorder 11/12/2019   Mild persistent asthma without complication 11/11/2019   Localized, primary osteoarthritis of shoulder region 05/17/2019      ROS Refer to HPI    Objective:     Outpatient Encounter Medications as of 10/06/2024  Medication Sig   acetaminophen (TYLENOL) 650 MG CR tablet Take 650 mg by mouth every 8 (eight) hours as needed for pain.   albuterol  (PROVENTIL ) (2.5 MG/3ML) 0.083% nebulizer solution Take 3 mLs (2.5 mg total) by nebulization every 6 (six) hours as needed for wheezing or shortness of breath.   Varenicline  Tartrate, Starter, 0.5 MG X 11 & 1 MG X 42 TBPK Days 1 to 3: 0.5 mg once daily, Days 4 to 7: 0.5 mg twice daily. Day 8: 1 mg twice daily   [START ON 11/03/2024] Varenicline  Tartrate,Continue, 1 MG TABS Take 1 mg by mouth in the morning and at bedtime.   [DISCONTINUED] albuterol  (VENTOLIN  HFA) 108 (90 Base) MCG/ACT inhaler Inhale 2 puffs into the lungs every 6 (six) hours as needed for wheezing or shortness of breath.   [DISCONTINUED] fluticasone -salmeterol (WIXELA INHUB) 500-50 MCG/ACT AEPB Inhale 1 puff into the lungs in the morning and at  bedtime.   albuterol  (VENTOLIN  HFA) 108 (90 Base) MCG/ACT inhaler Inhale 2 puffs into the lungs every 6 (six) hours as needed for wheezing or shortness of breath.   fluticasone -salmeterol (WIXELA INHUB) 500-50 MCG/ACT AEPB Inhale 1 puff into the lungs in the morning and at bedtime.   omeprazole  (PRILOSEC) 40 MG capsule Take 1 capsule (40 mg total) by mouth daily.   [DISCONTINUED] omeprazole  (PRILOSEC) 40 MG capsule TAKE 1 CAPSULE (40 MG TOTAL) BY MOUTH DAILY. (Patient not taking: Reported on 10/06/2024)   No facility-administered encounter medications on file as of 10/06/2024.    BP 118/76   Pulse 80   Temp 98 F (36.7 C) (Oral)   Ht 5' 2 (1.575 m)   Wt 178 lb 2 oz (80.8 kg)   SpO2 95%   BMI 32.58 kg/m  BP Readings from Last 3 Encounters:  10/06/24 118/76  02/04/24 128/80  11/01/22 124/80    Physical Exam Constitutional:      Appearance: Normal appearance.  HENT:     Mouth/Throat:     Mouth: Mucous membranes are moist.     Pharynx: Oropharynx is clear.  Cardiovascular:     Rate and Rhythm: Normal rate and regular rhythm.  Pulmonary:     Effort: Pulmonary effort is normal. No respiratory distress.     Breath sounds: Wheezing (bilateral bases) present.     Comments: No crackles Abdominal:     General: Abdomen is flat. Bowel sounds are normal.  There is no distension.     Palpations: Abdomen is soft.     Tenderness: There is no abdominal tenderness.  Musculoskeletal:        General: Normal range of motion.     Right lower leg: No edema.     Left lower leg: No edema.  Skin:    General: Skin is warm and dry.     Capillary Refill: Capillary refill takes less than 2 seconds.  Neurological:     General: No focal deficit present.     Mental Status: She is alert and oriented to person, place, and time.  Psychiatric:        Mood and Affect: Mood normal.        Behavior: Behavior normal.        10/06/2024    9:49 AM 02/04/2024    2:43 PM 10/09/2022    2:09 PM  Depression  screen PHQ 2/9  Decreased Interest 0 0 0  Down, Depressed, Hopeless 0 0 0  PHQ - 2 Score 0 0 0  Altered sleeping  2 1  Tired, decreased energy  1 2  Change in appetite  0 0  Feeling bad or failure about yourself   0 2  Trouble concentrating  0 2  Moving slowly or fidgety/restless  0 0  Suicidal thoughts  0 0  PHQ-9 Score  3  7   Difficult doing work/chores  Not difficult at all Not difficult at all     Data saved with a previous flowsheet row definition       10/06/2024    9:49 AM 02/04/2024    2:43 PM 10/09/2022    2:09 PM 08/16/2022    1:47 PM  GAD 7 : Generalized Anxiety Score  Nervous, Anxious, on Edge 0 1  0  0   Control/stop worrying 0 1  0  0   Worry too much - different things  1  0  0   Trouble relaxing  1  0  0   Restless  1  0  0   Easily annoyed or irritable  1  0  0   Afraid - awful might happen  0   0   Total GAD 7 Score  6  0  Anxiety Difficulty  Not difficult at all Not difficult at all Not difficult at all     Data saved with a previous flowsheet row definition    No results found for any visits on 10/06/24.  Last CBC Lab Results  Component Value Date   WBC 6.0 12/18/2022   HGB 13.2 12/18/2022   HCT 39.7 12/18/2022   MCV 88 12/18/2022   MCH 29.3 12/18/2022   RDW 12.7 12/18/2022   PLT 320 12/18/2022   Last metabolic panel Lab Results  Component Value Date   GLUCOSE 89 07/30/2022   NA 144 07/30/2022   K 4.4 07/30/2022   CL 107 (H) 07/30/2022   CO2 21 07/30/2022   BUN 12 07/30/2022   CREATININE 0.69 07/30/2022   EGFR 99 07/30/2022   CALCIUM 9.4 07/30/2022   PROT 6.7 07/30/2022   ALBUMIN 4.4 07/30/2022   LABGLOB 2.3 07/30/2022   AGRATIO 1.9 07/30/2022   BILITOT 0.3 07/30/2022   ALKPHOS 103 07/30/2022   AST 15 07/30/2022   ALT 19 07/30/2022   Last lipids Lab Results  Component Value Date   CHOL 236 (H) 07/30/2022   HDL 70 07/30/2022   LDLCALC 155 (H) 07/30/2022   TRIG  65 07/30/2022   CHOLHDL 3.4 07/30/2022   Last hemoglobin A1c Lab  Results  Component Value Date   HGBA1C 5.9 (H) 07/30/2022   Last thyroid functions Lab Results  Component Value Date   TSH 0.771 07/30/2022      The 10-year ASCVD risk score (Arnett DK, et al., 2019) is: 11.3%    Assessment & Plan:  Mild persistent asthma without complication Assessment & Plan: Has has asthma triggered by smoke from cooking oil at work, complete FLMA for her. Works in surveyor, mining at [employer].  Currently using Wixela twice daily  and albuterol , is using this about 2 times a week. Not using regularly. Has not followed up with pulm or have PFTs completed in 2024. Recommended using wixela regularly, continue albuterol  as needed, working on smoking cessation.   Orders: -     Fluticasone -Salmeterol; Inhale 1 puff into the lungs in the morning and at bedtime.  Dispense: 60 each; Refill: 5 -     Albuterol  Sulfate HFA; Inhale 2 puffs into the lungs every 6 (six) hours as needed for wheezing or shortness of breath.  Dispense: 18 g; Refill: 5  Moderate asthma with exacerbation, unspecified whether persistent  Tobacco use disorder Assessment & Plan: Smoking 3 cigarettes a day, interested in treatment. Start on chantix . She will consider a quit date. Follow up in 1 month.     Gastroesophageal reflux disease, unspecified whether esophagitis present Assessment & Plan: Is out of omeprazole , sx are well controlled on omeprazole . Denies dypsphgaia, melana, or unexpected weight loss. Refilled omeprazole  40 mg daily.   Orders: -     Omeprazole ; Take 1 capsule (40 mg total) by mouth daily.  Dispense: 30 capsule; Refill: 1  Mass of lower outer quadrant of left breast Assessment & Plan: 8 mm intraductal mass in the 5 o'clock retroareolar region of the left breast. Noted on diagnostic mammogram and US  on 01/08/2023, recommended breast biopsy. She denies breast pain, palpable mass, skin changes, or drainage. Deferred breast exam today. Mammogram and US  ordered She will call to  have this scheduled   Orders: -     MM 3D DIAGNOSTIC MAMMOGRAM BILATERAL BREAST; Future -     US  LIMITED ULTRASOUND INCLUDING AXILLA LEFT BREAST ; Future  Emphysema lung (HCC) Assessment & Plan: Noted on CT in 2024, she will call to schedule with pulm. Working on smoking cessation see tobacco use disorder.    Other orders -     Varenicline  Tartrate (Starter); Days 1 to 3: 0.5 mg once daily, Days 4 to 7: 0.5 mg twice daily. Day 8: 1 mg twice daily  Dispense: 53 each; Refill: 0 -     Varenicline  Tartrate(Continue); Take 1 mg by mouth in the morning and at bedtime.  Dispense: 60 tablet; Refill: 1     Return in about 4 weeks (around 11/03/2024) for smoking.    Harlene Saddler, MD "

## 2024-10-08 NOTE — Telephone Encounter (Signed)
 Pharmacy comment: Alternative Requested:NOT OVERED PLEASE CONTACT INSURANCE, THANKS.

## 2024-11-17 ENCOUNTER — Ambulatory Visit: Admitting: Student

## 2024-11-24 ENCOUNTER — Encounter: Admitting: Student
